# Patient Record
Sex: Male | Born: 1985 | Race: White | Hispanic: No | Marital: Married | Smoking: Former smoker
Health system: Southern US, Community
[De-identification: ages and names within clinical notes are randomized; demographics above are authoritative.]

## PROBLEM LIST (undated history)

## (undated) DIAGNOSIS — I10 Essential (primary) hypertension: Secondary | ICD-10-CM

## (undated) HISTORY — PX: TREATMENT FISTULA ANAL: SUR1390

## (undated) HISTORY — DX: Essential (primary) hypertension: I10

---
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
## 2008-10-15 ENCOUNTER — Emergency Department (HOSPITAL_COMMUNITY): Admission: EM | Admit: 2008-10-15 | Discharge: 2008-10-16 | Payer: Self-pay | Admitting: *Deleted

## 2015-05-06 ENCOUNTER — Other Ambulatory Visit (INDEPENDENT_AMBULATORY_CARE_PROVIDER_SITE_OTHER): Payer: Worker's Compensation

## 2015-05-06 DIAGNOSIS — Z7721 Contact with and (suspected) exposure to potentially hazardous body fluids: Secondary | ICD-10-CM

## 2015-05-06 NOTE — Progress Notes (Signed)
Lab only 

## 2015-05-07 LAB — HIV ANTIBODY (ROUTINE TESTING W REFLEX): HIV Screen 4th Generation wRfx: NONREACTIVE

## 2015-05-07 LAB — HEPATITIS PANEL, ACUTE
Hep A IgM: NEGATIVE
Hep B C IgM: NEGATIVE
Hep C Virus Ab: <0.1 s/co ratio (ref 0.0–0.9)
Hepatitis B Surface Ag: NEGATIVE

## 2015-05-09 ENCOUNTER — Telehealth: Payer: Self-pay | Admitting: *Deleted

## 2015-05-09 NOTE — Telephone Encounter (Signed)
-----   Message from Warren Stacks, MD sent at 05/07/2015 12:28 PM EDT ----- Hello Joseph Cabrera,    Your lab result is normal.Some minor variations that are not significant are commonly marked abnormal, but do not represent any medical problem for you.  Best regards, Warren Stacks, M.D. 

## 2015-05-09 NOTE — Telephone Encounter (Signed)
-----   Message from Mechele Claude, MD sent at 05/07/2015 12:28 PM EDT ----- Silvano Rusk,    Your lab result is normal.Some minor variations that are not significant are commonly marked abnormal, but do not represent any medical problem for you.  Best regards, Mechele Claude, M.D.

## 2015-05-09 NOTE — Telephone Encounter (Signed)
Pt notified of results Verbalizes understanding 

## 2015-05-09 NOTE — Telephone Encounter (Signed)
Tried to call pt regarding test results but no answer and no voicemail set up.

## 2015-06-03 ENCOUNTER — Encounter: Payer: Self-pay | Admitting: Physician Assistant

## 2015-06-03 ENCOUNTER — Ambulatory Visit (INDEPENDENT_AMBULATORY_CARE_PROVIDER_SITE_OTHER): Payer: Worker's Compensation | Admitting: Physician Assistant

## 2015-06-03 ENCOUNTER — Encounter (INDEPENDENT_AMBULATORY_CARE_PROVIDER_SITE_OTHER): Payer: Self-pay

## 2015-06-03 VITALS — BP 147/96 | HR 73 | Temp 97.1°F | Wt 185.0 lb

## 2015-06-03 DIAGNOSIS — W57XXXA Bitten or stung by nonvenomous insect and other nonvenomous arthropods, initial encounter: Secondary | ICD-10-CM

## 2015-06-03 DIAGNOSIS — S8991XA Unspecified injury of right lower leg, initial encounter: Secondary | ICD-10-CM | POA: Diagnosis not present

## 2015-06-03 DIAGNOSIS — Z23 Encounter for immunization: Secondary | ICD-10-CM | POA: Diagnosis not present

## 2015-06-03 DIAGNOSIS — L03115 Cellulitis of right lower limb: Secondary | ICD-10-CM
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
## 2015-06-03 NOTE — Patient Instructions (Signed)
Spider Bite Spider bites are not common. Most spider bites do not cause serious problems. The elderly, very young children, and people with certain existing medical conditions are more likely to experience significant symptoms. SYMPTOMS  Spider bites may not cause any pain at first. Within 1 or 2 days of the bite, there may be swelling, redness, and pain in the bite area. However, some spider bites can cause pain within the first hour. TREATMENT  Your caregiver may prescribe antibiotic medicine if a bacterial infection develops in the bite. However, not all spider bites require antibiotics or prescription medicines.  HOME CARE INSTRUCTIONS  Do not scratch the bite area.  Keep the bite area clean and dry. Wash the area with soap and water as directed.  Put ice or cool compresses on the bite area.  Put ice in a plastic bag.  Place a towel between your skin and the bag.  Leave the ice on for 20 minutes, 4 times a day for the first 2 to 3 days, or as directed.  Keep the bite area elevated above the level of your heart. This helps reduce redness and swelling.  Only take over-the-counter or prescription medicines as directed by your caregiver.  If you are given antibiotics, take them as directed. Finish them even if you start to feel better. You may need a tetanus shot if:  You cannot remember when you had your last tetanus shot.  You have never had a tetanus shot.  The injury broke your skin. If you get a tetanus shot, your arm may swell, get red, and feel warm to the touch. This is common and not a problem. If you need a tetanus shot and you choose not to have one, there is a rare chance of getting tetanus. Sickness from tetanus can be serious. SEEK MEDICAL CARE IF: Your bite is not better after 3 days of treatment. SEEK IMMEDIATE MEDICAL CARE IF:  Your bite turns purple or develops increased swelling, pain, or redness.  You develop shortness of breath or chest pain.  You have  muscle cramps or painful muscle spasms.  You develop abdominal pain, nausea, or vomiting.  You feel unusually tired or sleepy. MAKE SURE YOU:  Understand these instructions.  Will watch your condition.  Will get help right away if you are not doing well or get worse. Document Released: 12/20/2004 Document Revised: 02/04/2012 Document Reviewed: 06/13/2011 ExitCare Patient Information 2015 [employer]. This information is not intended to replace advice given to you by your health care provider. Make sure you discuss any questions you have with your health care provider.  

## 2015-06-03 NOTE — Addendum Note (Signed)
Addended by: Fawn KirkHOLT, CATHY on: 06/03/2015 09:46 AM   Modules accepted: Orders

## 2015-06-10 ENCOUNTER — Ambulatory Visit: Payer: Self-pay | Admitting: Physician Assistant

## 2015-06-13 ENCOUNTER — Telehealth: Payer: Self-pay | Admitting: Physician Assistant

## 2015-06-13 ENCOUNTER — Ambulatory Visit: Payer: Self-pay | Admitting: Physician Assistant

## 2015-06-13 NOTE — Telephone Encounter (Signed)
Stp and gave appt tomorrow with Montey HoraBill Webster at 4:10 for W/C recheck.

## 2015-06-14 ENCOUNTER — Ambulatory Visit (INDEPENDENT_AMBULATORY_CARE_PROVIDER_SITE_OTHER): Payer: Worker's Compensation | Admitting: Physician Assistant

## 2015-06-14 ENCOUNTER — Encounter: Payer: Self-pay | Admitting: Physician Assistant

## 2015-06-14 VITALS — BP 152/98 | HR 86 | Wt 167.0 lb

## 2015-06-14 DIAGNOSIS — L03115 Cellulitis of right lower limb: Secondary | ICD-10-CM

## 2015-06-14 DIAGNOSIS — S8991XD Unspecified injury of right lower leg, subsequent encounter: Secondary | ICD-10-CM | POA: Diagnosis not present

## 2015-06-14 DIAGNOSIS — W57XXXA Bitten or stung by nonvenomous insect and other nonvenomous arthropods, initial encounter: Secondary | ICD-10-CM
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
## 2016-02-10 ENCOUNTER — Emergency Department (HOSPITAL_COMMUNITY)
Admission: EM | Admit: 2016-02-10 | Discharge: 2016-02-10 | Disposition: A | Discharge status: Home / Self Care | Payer: PRIVATE HEALTH INSURANCE | Attending: Emergency Medicine | Admitting: Emergency Medicine

## 2016-02-10 ENCOUNTER — Encounter (HOSPITAL_COMMUNITY): Payer: Self-pay | Admitting: *Deleted

## 2016-02-10 DIAGNOSIS — Z87891 Personal history of nicotine dependence: Secondary | ICD-10-CM | POA: Insufficient documentation

## 2016-02-10 DIAGNOSIS — R110 Nausea: Secondary | ICD-10-CM | POA: Insufficient documentation

## 2016-02-10 DIAGNOSIS — R42 Dizziness and giddiness: Secondary | ICD-10-CM | POA: Diagnosis present

## 2016-02-10 DIAGNOSIS — R002 Palpitations: Secondary | ICD-10-CM | POA: Insufficient documentation

## 2016-02-10 LAB — BASIC METABOLIC PANEL WITH GFR
Anion gap: 6 (ref 5–15)
BUN: 11 mg/dL (ref 6–20)
CO2: 30 mmol/L (ref 22–32)
Calcium: 9.7 mg/dL (ref 8.9–10.3)
Chloride: 102 mmol/L (ref 101–111)
Creatinine, Ser: 1.05 mg/dL (ref 0.61–1.24)
GFR calc Af Amer: >60 mL/min (ref >60)
GFR calc non Af Amer: >60 mL/min (ref >60)
Glucose, Bld: 102 mg/dL — ABNORMAL HIGH (ref 65–99)
Potassium: 4.5 mmol/L (ref 3.5–5.1)
Sodium: 138 mmol/L (ref 135–145)

## 2016-02-10 LAB — CBC WITH DIFFERENTIAL/PLATELET
Basophils Absolute: 0 K/uL (ref 0.0–0.1)
Basophils Relative: 0 %
Eosinophils Absolute: 0.2 K/uL (ref 0.0–0.7)
Eosinophils Relative: 2 %
HCT: 48.3 % (ref 39.0–52.0)
Hemoglobin: 16.6 g/dL (ref 13.0–17.0)
Lymphocytes Relative: 30 %
Lymphs Abs: 2.4 K/uL (ref 0.7–4.0)
MCH: 30.3 pg (ref 26.0–34.0)
MCHC: 34.4 g/dL (ref 30.0–36.0)
MCV: 88.1 fL (ref 78.0–100.0)
Monocytes Absolute: 0.6 K/uL (ref 0.1–1.0)
Monocytes Relative: 7 %
Neutro Abs: 4.7 K/uL (ref 1.7–7.7)
Neutrophils Relative %: 61 %
Platelets: 288 K/uL (ref 150–400)
RBC: 5.48 MIL/uL (ref 4.22–5.81)
RDW: 11.8 % (ref 11.5–15.5)
WBC: 7.8 K/uL (ref 4.0–10.5)

## 2016-02-10 NOTE — ED Provider Notes (Signed)
CSN: [id number]     Arrival date & time 02/10/16  1326 History   First MD Initiated Contact with Patient 02/10/16 1516     Chief Complaint  Patient presents with  . Dizziness     (Consider location/radiation/quality/duration/timing/severity/associated sxs/prior Treatment) HPI  Joseph Cabrera is a 30 y.o. male who presents to the Emergency Department complaining of sudden onset of "feeling dizzy and lightheaded" earlier today while at work.  He also reports associated nausea and felt like his heart was "fluttering."  Symptoms lasted approximately one hour.  He states that EMS was contacted, and it was noted that his BP was elevated.  He states that this has occurred previously but improved after discontinuing chewing tobacco.  He also admits to high salt, high caffeine diet and minimal water intake.  He denies headache, visual changes, recent illness, chest pain, shortness of breath, numbness or weakness.  Also denies medication or illicit drug use.   History reviewed. No pertinent past medical history. History reviewed. No pertinent past surgical history. History reviewed. No pertinent family history. Social History  Substance Use Topics  . Smoking status: Former Games developer  . Smokeless tobacco: None  . Alcohol Use: Yes     Comment: occasional    Review of Systems  Constitutional: Negative for fever, chills, activity change and appetite change.  Respiratory: Negative for cough, chest tightness and shortness of breath.   Gastrointestinal: Positive for nausea. Negative for vomiting and abdominal pain.  Genitourinary: Negative for difficulty urinating.  Musculoskeletal: Negative for arthralgias, gait problem, neck pain and neck stiffness.  Skin: Negative for rash.  Neurological: Positive for dizziness and light-headedness. Negative for seizures, syncope, speech difficulty, weakness, numbness and headaches.  Psychiatric/Behavioral: Negative for confusion. The patient is not nervous/anxious.        Allergies  Review of patient's allergies indicates no known allergies.  Home Medications   Prior to Admission medications   Not on File   BP 164/98 mmHg  Pulse 100  Temp(Src) 98.6 F (37 C) (Oral)  Ht  (1.778 m)  Wt 82.555 kg  BMI 26.11 kg/m2  SpO2 98% Physical Exam  Constitutional: He is oriented to person, place, and time. He appears well-developed and well-nourished. No distress.  HENT:  Head: Normocephalic and atraumatic.  Mouth/Throat: Oropharynx is clear and moist.  Eyes: EOM are normal. Pupils are equal, round, and reactive to light.  Neck: Normal range of motion. Neck supple.  Cardiovascular: Normal rate, regular rhythm, normal heart sounds and intact distal pulses.   No murmur heard. Pulmonary/Chest: Effort normal and breath sounds normal. No respiratory distress. He exhibits no tenderness.  Musculoskeletal: Normal range of motion.  Neurological: He is alert and oriented to person, place, and time. Coordination normal.  Skin: Skin is warm and dry.  Psychiatric: He has a normal mood and affect. Thought content normal.  Nursing note and vitals reviewed.   ED Course  Procedures (including critical care time) Labs Review Labs Reviewed  BASIC METABOLIC PANEL - Abnormal; Notable for the following:    Glucose, Bld 102 (*)    All other components within normal limits  CBC WITH DIFFERENTIAL/PLATELET    Imaging Review No results found. I have personally reviewed and evaluated these images and lab results as part of my medical decision-making.   EKG Interpretation None       ED ECG REPORT   Date: 02/10/2016  Rate: 95  Rhythm: normal sinus rhythm  QRS Axis: normal  Intervals: normal  ST/T Wave  abnormalities: normal  Conduction Disutrbances:none  Narrative Interpretation:   Old EKG Reviewed: none available   EKG reviewed by Dr. Jodi MourningZavitz.    MDM   Final diagnoses:  Dizziness, nonspecific  Palpitations    This is a shared visit.  Pt  also seen by Dr. Jodi MourningZavitz and tx plan discussed.    Discussed dietary changes, avoidance of caffeine, increase water intake and close PMD f/u.  I will also give referral for cards for possible holter monitor.  Pt agrees to plan and appears stable for d/c  Pauline Ausammy Amayah Staheli, PA-C 02/11/16 2122  Blane OharaJoshua Zavitz, MD 02/11/16 2326

## 2016-02-10 NOTE — Discharge Instructions (Signed)
Dizziness Dizziness is a common problem. It makes you feel unsteady or lightheaded. You may feel like you are about to pass out (faint). Dizziness can lead to injury if you stumble or fall. Anyone can get dizzy, but dizziness is more common in older adults. This condition can be caused by a number of things, including:  Medicines.  Dehydration.  Illness. HOME CARE Following these instructions may help with your condition: Eating and Drinking  Drink enough fluid to keep your pee (urine) clear or pale yellow. This helps to keep you from getting dehydrated. Try to drink more clear fluids, such as water.  Do not drink alcohol.  Limit how much caffeine you drink or eat if told by your doctor.  Limit how much salt you drink or eat if told by your doctor. Activity  Avoid making quick movements.  When you stand up from sitting in a chair, steady yourself until you feel okay.  In the morning, first sit up on the side of the bed. When you feel okay, stand slowly while you hold onto something. Do this until you know that your balance is fine.  Move your legs often if you need to stand in one place for a long time. Tighten and relax your muscles in your legs while you are standing.  Do not drive or use heavy machinery if you feel dizzy.  Avoid bending down if you feel dizzy. Place items in your home so that they are easy for you to reach without leaning over. Lifestyle  Do not use any tobacco products, including cigarettes, chewing tobacco, or electronic cigarettes. If you need help quitting, ask your doctor.  Try to lower your stress level, such as with yoga or meditation. Talk with your doctor if you need help. General Instructions  Watch your dizziness for any changes.  Take medicines only as told by your doctor. Talk with your doctor if you think that your dizziness is caused by a medicine that you are taking.  Tell a friend or a family member that you are feeling dizzy. If he or  she notices any changes in your behavior, have this person call your doctor.  Keep all follow-up visits as told by your doctor. This is important. GET HELP IF:  Your dizziness does not go away.  Your dizziness or light-headedness gets worse.  You feel sick to your stomach (nauseous).  You have trouble hearing.  You have new symptoms.  You are unsteady on your feet or you feel like the room is spinning. GET HELP RIGHT AWAY IF:  You throw up (vomit) or have diarrhea and are unable to eat or drink anything.  You have trouble:  Talking.  Walking.  Swallowing.  Using your arms, hands, or legs.  You feel generally weak.  You are not thinking clearly or you have trouble forming sentences. It may take a friend or family member to notice this.  You have:  Chest pain.  Pain in your belly (abdomen).  Shortness of breath.  Sweating.  Your vision changes.  You are bleeding.  You have a headache.  You have neck pain or a stiff neck.  You have a fever.   This information is not intended to replace advice given to you by your health care provider. Make sure you discuss any questions you have with your health care provider.   Document Released: 11/01/2011 Document Revised: 03/29/2015 Document Reviewed: 11/08/2014 Elsevier Interactive Patient Education 2016 [employer].  Palpitations A palpitation is  the feeling that your heartbeat is irregular. It may feel like your heart is fluttering or skipping a beat. It may also feel like your heart is beating faster than normal. This is usually not a serious problem. In some cases, you may need more medical tests. HOME CARE  Avoid:  Caffeine in coffee, tea, soft drinks, diet pills, and energy drinks.  Chocolate.  Alcohol.  Stop smoking if you smoke.  Reduce your stress and anxiety. Try:  A method that measures bodily functions so you can learn to control them (biofeedback).  Yoga.  Meditation.  Physical activity  such as swimming, jogging, or walking.  Get plenty of rest and sleep. GET HELP IF:  Your fast or irregular heartbeat continues after 24 hours.  Your palpitations occur more often. GET HELP RIGHT AWAY IF:   You have chest pain.  You feel short of breath.  You have a very bad headache.  You feel dizzy or pass out (faint). MAKE SURE YOU:   Understand these instructions.  Will watch your condition.  Will get help right away if you are not doing well or get worse.   This information is not intended to replace advice given to you by your health care provider. Make sure you discuss any questions you have with your health care provider.   Document Released: 08/21/2008 Document Revised: 12/03/2014 Document Reviewed: 01/11/2012 Elsevier Interactive Patient Education [employer]2016 Elsevier Inc.

## 2016-02-10 NOTE — ED Notes (Signed)
Pt was at work today and became lightheaded and nauseated. EMS called out and BP was 159/94. Patient reports "heart fluttering" at that time. Denies any pain at this time, but is still nauseated.

## 2016-09-13 ENCOUNTER — Ambulatory Visit (INDEPENDENT_AMBULATORY_CARE_PROVIDER_SITE_OTHER): Payer: PRIVATE HEALTH INSURANCE | Admitting: *Deleted

## 2016-09-13 DIAGNOSIS — Z23 Encounter for immunization: Secondary | ICD-10-CM | POA: Diagnosis not present

## 2017-06-12 ENCOUNTER — Ambulatory Visit (INDEPENDENT_AMBULATORY_CARE_PROVIDER_SITE_OTHER): Payer: BC Managed Care – PPO | Admitting: Physician Assistant

## 2017-06-12 ENCOUNTER — Encounter: Payer: Self-pay | Admitting: Physician Assistant

## 2017-06-12 VITALS — BP 131/92 | HR 76 | Temp 97.5°F | Ht 70.0 in | Wt 190.6 lb

## 2017-06-12 DIAGNOSIS — F4310 Post-traumatic stress disorder, unspecified: Secondary | ICD-10-CM

## 2017-06-12 DIAGNOSIS — I10 Essential (primary) hypertension: Secondary | ICD-10-CM

## 2017-06-12 MED ORDER — TRAZODONE HCL 50 MG PO TABS: 50.0000 mg | ORAL_TABLET | Freq: Every evening | ORAL | 3 refills | Status: DC | PRN

## 2017-06-12 MED ORDER — LISINOPRIL 10 MG PO TABS: 10.0000 mg | ORAL_TABLET | Freq: Every day | ORAL | 3 refills | Status: DC

## 2017-06-12 NOTE — Progress Notes (Signed)
BP (!) 131/92   Pulse 76   Temp (!) 97.5 F (36.4 C) (Oral)   Ht 5\' 10"  (1.778 m)   Wt 190 lb 9.6 oz (86.5 kg)   BMI 27.35 kg/m    Subjective:    Patient ID: Joseph Cabrera, male    DOB: 1986/03/21, 31 y.o.   MRN: [id number]  HPI: Joseph Cabrera is a 31 y.o. male presenting on 06/12/2017 for New Patient (Initial Visit); Hypertension; and Post-Traumatic Stress Disorder  This is a new patient to us. He is overall a healthy young male. 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 He has had some issues with elevated blood pressure in the past. He has had several readings where they're in the 140-150 range systolically. He has a sister with elevated blood pressure also. He has tried ram up real but never really reduced his blood pressure. In addition he is having significant stressors related to the job. He has some posttraumatic thinking related to some things that he is seen. He has interrupted sleep. His pHQ score was only 4. Depression screen PHQ 2/9 06/12/2017  Decreased Interest 1  Down, Depressed, Hopeless 1  PHQ - 2 Score 2  Altered sleeping 1  Tired, decreased energy 1  Change in appetite 0  Feeling bad or failure about yourself  0  Trouble concentrating 0  Moving slowly or fidgety/restless 0  Suicidal thoughts 0  PHQ-9 Score 4     Relevant past medical, surgical, family and social history reviewed and updated as indicated. Allergies and medications reviewed and updated.  Past Medical History:  Diagnosis Date  . Hypertension     History reviewed. No pertinent surgical history.  Review of Systems  Constitutional: Negative.  Negative for appetite change and fatigue.  HENT: Negative.   Eyes: Negative.  Negative for pain and visual disturbance.  Respiratory: Negative.  Negative for cough, chest tightness, shortness of breath and wheezing.   Cardiovascular: Negative.  Negative for chest pain, palpitations and leg swelling.  Gastrointestinal:  Negative.  Negative for abdominal pain, diarrhea, nausea and vomiting.  Endocrine: Negative.   Genitourinary: Negative.   Musculoskeletal: Negative.   Skin: Negative.  Negative for color change and rash.  Neurological: Negative.  Negative for weakness, numbness and headaches.  Psychiatric/Behavioral: Positive for agitation and sleep disturbance. Negative for dysphoric mood. The patient is nervous/anxious.     Allergies as of 06/12/2017      Reactions   Shellfish Allergy Anaphylaxis      Medication List       Accurate as of 06/12/17  9:08 AM. Always use your most recent med list.          lisinopril 10 MG tablet Commonly known as:  PRINIVIL,ZESTRIL Take 1 tablet (10 mg total) by mouth daily.   traZODone 50 MG tablet Commonly known as:  DESYREL Take 1-2 tablets (50-100 mg total) by mouth at bedtime as needed for sleep.          Objective:    BP (!) 131/92   Pulse 76   Temp (!) 97.5 F (36.4 C) (Oral)   Ht 5\' 10"  (1.778 m)   Wt 190 lb 9.6 oz (86.5 kg)   BMI 27.35 kg/m   Allergies  Allergen Reactions  . Shellfish Allergy Anaphylaxis    Physical Exam  Constitutional: He appears well-developed and well-nourished.  HENT:  Head: Normocephalic and atraumatic.  Eyes: Pupils are equal, round, and reactive to light. Conjunctivae and EOM  are normal.  Neck: Normal range of motion. Neck supple.  Cardiovascular: Normal rate, regular rhythm and normal heart sounds.   Pulmonary/Chest: Effort normal and breath sounds normal.  Abdominal: Soft. Bowel sounds are normal.  Musculoskeletal: Normal range of motion.  Skin: Skin is warm and dry.  Nursing note and vitals reviewed.       Assessment & Plan:   1. Essential hypertension - lisinopril (PRINIVIL,ZESTRIL) 10 MG tablet; Take 1 tablet (10 mg total) by mouth daily.  Dispense: 90 tablet; Refill: 3  2. PTSD (post-traumatic stress disorder) - traZODone (DESYREL) 50 MG tablet; Take 1-2 tablets (50-100 mg total) by mouth at  bedtime as needed for sleep.  Dispense: 60 tablet; Refill: 3    Continue all other maintenance medications as listed above.  Follow up plan: Return in about 4 weeks (around 07/10/2017) for recheck.  Educational handout given for psychiatry  Remus Loffler PA-C [facility] [address]  [place], [place] [address] [phone]   06/12/2017, 9:08 AM

## 2017-06-12 NOTE — Patient Instructions (Signed)
Your provider wants you to schedule an appointment with a Psychologist/Psychiatrist. The following list of offices requires the patient to call and make their own appointment, as there is information they need that only you can provide. Please feel free to choose form the following providers:  [facility]   [phone] Crisis Recovery in [place] [phone]  [facility]  [phone]   [address] [place], [place]  (Scheduled through [facility]) Must call and do an interview for appointment. Sees Children / Accepts Medicaid  [facility]    [phone]  [address], [address]    [place], [place]       [facility]  [phone] [address] [place], [place]  Evaluates for Autism but does not treat it Sees Children / Accepts Medicaid  [facility]    [phone] [address], [address]   [place], [place] Medication management, substance abuse, bipolar, grief, family, marriage, OCD, anxiety, PTSD Sees children / Accepts Medicaid  [facility]    [phone] [address] [place], [place] Sees children / Accepts Medicaid  [facility]  [phone] [address] [place], [place]   Dr Akinlayo     [phone] [address] [place], [place]  Sees ADD & ADHD for treatment Accepts Medicaid  [facility]  [phone] [address] [place], [place] Evaluates for Autism Accepts Medicaid  [facility]  [phone] [address]  [place], [place]  Does Adult ADD evaluations Does not accept Medicaid  [facility]   [phone] [address]   [place], [place] Uses animal therapy  Sees children as young as 3 years old Accepts Medicaid  [facility]     [phone]    [address]  [place], [place] [address] Sees children Accepts Medicaid  

## 2017-07-11 ENCOUNTER — Encounter: Payer: Self-pay | Admitting: Physician Assistant

## 2017-07-11 ENCOUNTER — Ambulatory Visit (INDEPENDENT_AMBULATORY_CARE_PROVIDER_SITE_OTHER): Payer: BC Managed Care – PPO | Admitting: Physician Assistant

## 2017-07-11 VITALS — BP 112/72 | HR 83 | Temp 98.5°F | Ht 70.0 in | Wt 184.4 lb

## 2017-07-11 DIAGNOSIS — I10 Essential (primary) hypertension: Secondary | ICD-10-CM | POA: Diagnosis not present

## 2017-07-11 DIAGNOSIS — F4310 Post-traumatic stress disorder, unspecified: Secondary | ICD-10-CM

## 2017-07-11 MED ORDER — TRAZODONE HCL 100 MG PO TABS: 50.0000 mg | ORAL_TABLET | Freq: Every evening | ORAL | 5 refills | Status: DC | PRN

## 2017-07-11 NOTE — Progress Notes (Signed)
BP 112/72   Pulse 83   Temp 98.5 F (36.9 C) (Oral)   Ht 5\' 10"  (1.778 m)   Wt 184 lb 6.4 oz (83.6 kg)   BMI 26.46 kg/m    Subjective:    Patient ID: Joseph Cabrera, male    DOB: 01/07/1986, 31 y.o.   MRN: [id number]  HPI: Joseph Cabrera is a 31 y.o. male presenting on 07/11/2017 for Follow-up (Bp meds and depression meds)  This patient comes in for periodic recheck on medications and conditions including Hypertension and posttraumatic stress disorder with sleep disturbance. He states that he has been taking both of the medications. The first couple weeks when he started the trazodone 50-100 mg at bedtime it improved his sleep. However he is not having as much improvement now. We have discussed increasing this medication. He has tolerated it well. We will go ahead and progress until 200 mg. His blood pressure is quite good. He is tolerating the medication very well. We'll plan to see him back in a few months. He is to call if there is any changes in his symptoms.   All medications are reviewed today. There are no reports of any problems with the medications. All of the medical conditions are reviewed and updated.  Lab work is reviewed and will be ordered as medically necessary. There are no new problems reported with today's visit.   Relevant past medical, surgical, family and social history reviewed and updated as indicated. Allergies and medications reviewed and updated.  Past Medical History:  Diagnosis Date  . Hypertension     History reviewed. No pertinent surgical history.  Review of Systems  Constitutional: Negative.  Negative for appetite change and fatigue.  HENT: Negative.   Eyes: Negative.  Negative for pain and visual disturbance.  Respiratory: Negative.  Negative for cough, chest tightness, shortness of breath and wheezing.   Cardiovascular: Negative.  Negative for chest pain, palpitations and leg swelling.  Gastrointestinal: Negative.  Negative for abdominal pain,  diarrhea, nausea and vomiting.  Endocrine: Negative.   Genitourinary: Negative.   Musculoskeletal: Negative.   Skin: Negative.  Negative for color change and rash.  Neurological: Negative.  Negative for weakness, numbness and headaches.  Psychiatric/Behavioral: Positive for sleep disturbance. The patient is nervous/anxious.     Allergies as of 07/11/2017      Reactions   Shellfish Allergy Anaphylaxis      Medication List       Accurate as of 07/11/17  9:21 AM. Always use your most recent med list.          lisinopril 10 MG tablet Commonly known as:  PRINIVIL,ZESTRIL Take 1 tablet (10 mg total) by mouth daily.   traZODone 100 MG tablet Commonly known as:  DESYREL Take 0.5-1 tablets (50-100 mg total) by mouth at bedtime as needed for sleep.          Objective:    BP 112/72   Pulse 83   Temp 98.5 F (36.9 C) (Oral)   Ht 5\' 10"  (1.778 m)   Wt 184 lb 6.4 oz (83.6 kg)   BMI 26.46 kg/m   Allergies  Allergen Reactions  . Shellfish Allergy Anaphylaxis    Physical Exam  Constitutional: He appears well-developed and well-nourished. No distress.  HENT:  Head: Normocephalic and atraumatic.  Eyes: Pupils are equal, round, and reactive to light. Conjunctivae and EOM are normal.  Cardiovascular: Normal rate, regular rhythm and normal heart sounds.   Pulmonary/Chest: Effort normal and breath sounds  normal. No respiratory distress.  Skin: Skin is warm and dry.  Psychiatric: He has a normal mood and affect. His behavior is normal.  Nursing note and vitals reviewed.       Assessment & Plan:   1. Essential hypertension  2. PTSD (post-traumatic stress disorder) - traZODone (DESYREL) 100 MG tablet; Take 0.5-1 tablets (50-100 mg total) by mouth at bedtime as needed for sleep.  Dispense: 60 tablet; Refill: 5    Current Outpatient Prescriptions:  .  lisinopril (PRINIVIL,ZESTRIL) 10 MG tablet, Take 1 tablet (10 mg total) by mouth daily., Disp: 90 tablet, Rfl: 3 .   traZODone (DESYREL) 100 MG tablet, Take 0.5-1 tablets (50-100 mg total) by mouth at bedtime as needed for sleep., Disp: 60 tablet, Rfl: 5 Continue all other maintenance medications as listed above.  Follow up plan: Return in about 6 months (around 01/11/2018).  Educational handout given for DASH diet  Remus Loffler PA-C [facility] [address]  [place], [place] [address] [phone]   07/11/2017, 9:21 AM

## 2017-07-11 NOTE — Patient Instructions (Signed)
DASH Eating Plan DASH stands for "Dietary Approaches to Stop Hypertension." The DASH eating plan is a healthy eating plan that has been shown to reduce high blood pressure (hypertension). It may also reduce your risk for type 2 diabetes, heart disease, and stroke. The DASH eating plan may also help with weight loss. What are tips for following this plan? General guidelines  Avoid eating more than 2,300 mg (milligrams) of salt (sodium) a day. If you have hypertension, you may need to reduce your sodium intake to 1,500 mg a day.  Limit alcohol intake to no more than 1 drink a day for nonpregnant women and 2 drinks a day for men. One drink equals 12 oz of beer, 5 oz of wine, or 1 oz of hard liquor.  Work with your health care provider to maintain a healthy body weight or to lose weight. Ask what an ideal weight is for you.  Get at least 30 minutes of exercise that causes your heart to beat faster (aerobic exercise) most days of the week. Activities may include walking, swimming, or biking.  Work with your health care provider or diet and nutrition specialist (dietitian) to adjust your eating plan to your individual calorie needs. Reading food labels  Check food labels for the amount of sodium per serving. Choose foods with less than 5 percent of the Daily Value of sodium. Generally, foods with less than 300 mg of sodium per serving fit into this eating plan.  To find whole grains, look for the word "whole" as the first word in the ingredient list. Shopping  Buy products labeled as "low-sodium" or "no salt added."  Buy fresh foods. Avoid canned foods and premade or frozen meals. Cooking  Avoid adding salt when cooking. Use salt-free seasonings or herbs instead of table salt or sea salt. Check with your health care provider or pharmacist before using salt substitutes.  Do not fry foods. Cook foods using healthy methods such as baking, boiling, grilling, and broiling instead.  Cook with  heart-healthy oils, such as olive, canola, soybean, or sunflower oil. Meal planning   Eat a balanced diet that includes: ? 5 or more servings of fruits and vegetables each day. At each meal, try to fill half of your plate with fruits and vegetables. ? Up to 6-8 servings of whole grains each day. ? Less than 6 oz of lean meat, poultry, or fish each day. A 3-oz serving of meat is about the same size as a deck of cards. One egg equals 1 oz. ? 2 servings of low-fat dairy each day. ? A serving of nuts, seeds, or beans 5 times each week. ? Heart-healthy fats. Healthy fats called Omega-3 fatty acids are found in foods such as flaxseeds and coldwater fish, like sardines, salmon, and mackerel.  Limit how much you eat of the following: ? Canned or prepackaged foods. ? Food that is high in trans fat, such as fried foods. ? Food that is high in saturated fat, such as fatty meat. ? Sweets, desserts, sugary drinks, and other foods with added sugar. ? Full-fat dairy products.  Do not salt foods before eating.  Try to eat at least 2 vegetarian meals each week.  Eat more home-cooked food and less restaurant, buffet, and fast food.  When eating at a restaurant, ask that your food be prepared with less salt or no salt, if possible. What foods are recommended? The items listed may not be a complete list. Talk with your dietitian about what   dietary choices are best for you. Grains Whole-grain or whole-wheat bread. Whole-grain or whole-wheat pasta. Brown rice. Oatmeal. Quinoa. Bulgur. Whole-grain and low-sodium cereals. Pita bread. Low-fat, low-sodium crackers. Whole-wheat flour tortillas. Vegetables Fresh or frozen vegetables (raw, steamed, roasted, or grilled). Low-sodium or reduced-sodium tomato and vegetable juice. Low-sodium or reduced-sodium tomato sauce and tomato paste. Low-sodium or reduced-sodium canned vegetables. Fruits All fresh, dried, or frozen fruit. Canned fruit in natural juice (without  added sugar). Meat and other protein foods Skinless chicken or turkey. Ground chicken or turkey. Pork with fat trimmed off. Fish and seafood. Egg whites. Dried beans, peas, or lentils. Unsalted nuts, nut butters, and seeds. Unsalted canned beans. Lean cuts of beef with fat trimmed off. Low-sodium, lean deli meat. Dairy Low-fat (1%) or fat-free (skim) milk. Fat-free, low-fat, or reduced-fat cheeses. Nonfat, low-sodium ricotta or cottage cheese. Low-fat or nonfat yogurt. Low-fat, low-sodium cheese. Fats and oils Soft margarine without trans fats. Vegetable oil. Low-fat, reduced-fat, or light mayonnaise and salad dressings (reduced-sodium). Canola, safflower, olive, soybean, and sunflower oils. Avocado. Seasoning and other foods Herbs. Spices. Seasoning mixes without salt. Unsalted popcorn and pretzels. Fat-free sweets. What foods are not recommended? The items listed may not be a complete list. Talk with your dietitian about what dietary choices are best for you. Grains Baked goods made with fat, such as croissants, muffins, or some breads. Dry pasta or rice meal packs. Vegetables Creamed or fried vegetables. Vegetables in a cheese sauce. Regular canned vegetables (not low-sodium or reduced-sodium). Regular canned tomato sauce and paste (not low-sodium or reduced-sodium). Regular tomato and vegetable juice (not low-sodium or reduced-sodium). Pickles. Olives. Fruits Canned fruit in a light or heavy syrup. Fried fruit. Fruit in cream or butter sauce. Meat and other protein foods Fatty cuts of meat. Ribs. Fried meat. Bacon. Sausage. Bologna and other processed lunch meats. Salami. Fatback. Hotdogs. Bratwurst. Salted nuts and seeds. Canned beans with added salt. Canned or smoked fish. Whole eggs or egg yolks. Chicken or turkey with skin. Dairy Whole or 2% milk, cream, and half-and-half. Whole or full-fat cream cheese. Whole-fat or sweetened yogurt. Full-fat cheese. Nondairy creamers. Whipped toppings.  Processed cheese and cheese spreads. Fats and oils Butter. Stick margarine. Lard. Shortening. Ghee. Bacon fat. Tropical oils, such as coconut, palm kernel, or palm oil. Seasoning and other foods Salted popcorn and pretzels. Onion salt, garlic salt, seasoned salt, table salt, and sea salt. Worcestershire sauce. Tartar sauce. Barbecue sauce. Teriyaki sauce. Soy sauce, including reduced-sodium. Steak sauce. Canned and packaged gravies. Fish sauce. Oyster sauce. Cocktail sauce. Horseradish that you find on the shelf. Ketchup. Mustard. Meat flavorings and tenderizers. Bouillon cubes. Hot sauce and Tabasco sauce. Premade or packaged marinades. Premade or packaged taco seasonings. Relishes. Regular salad dressings. Where to find more information:  National Heart, Lung, and Blood Institute: www.nhlbi.nih.gov  American Heart Association: www.heart.org Summary  The DASH eating plan is a healthy eating plan that has been shown to reduce high blood pressure (hypertension). It may also reduce your risk for type 2 diabetes, heart disease, and stroke.  With the DASH eating plan, you should limit salt (sodium) intake to 2,300 mg a day. If you have hypertension, you may need to reduce your sodium intake to 1,500 mg a day.  When on the DASH eating plan, aim to eat more fresh fruits and vegetables, whole grains, lean proteins, low-fat dairy, and heart-healthy fats.  Work with your health care provider or diet and nutrition specialist (dietitian) to adjust your eating plan to your individual   calorie needs. This information is not intended to replace advice given to you by your health care provider. Make sure you discuss any questions you have with your health care provider. Document Released: 11/01/2011 Document Revised: 11/05/2016 Document Reviewed: 11/05/2016 Elsevier Interactive Patient Education  2017 Elsevier Inc.  

## 2017-12-23 ENCOUNTER — Encounter: Payer: Self-pay | Admitting: Pediatrics

## 2017-12-23 ENCOUNTER — Ambulatory Visit: Payer: BLUE CROSS/BLUE SHIELD | Admitting: Pediatrics

## 2017-12-23 VITALS — BP 137/86 | HR 93 | Temp 98.1°F | Ht 70.0 in | Wt 178.2 lb

## 2017-12-23 DIAGNOSIS — J069 Acute upper respiratory infection, unspecified: Secondary | ICD-10-CM | POA: Diagnosis not present

## 2017-12-23 NOTE — Progress Notes (Signed)
  Subjective:   Patient ID: Joseph Cabrera, male    DOB: 1986/10/25, 32 y.o.   MRN: [id number] CC: Fever (101 last night. 99.5 before left the house); Sinusitis (head congestion last day & a half no body aches or not); and Cough (not that bad, son doesn't have flu just bad sinus w/ cough)  HPI: Joseph BinetJoshua Norrod is a 32 y.o. male presenting for Fever (101 last night. 99.5 before left the house); Sinusitis (head congestion last day & a half no body aches or not); and Cough (not that bad, son doesn't have flu just bad sinus w/ cough)  Symptoms started two days ago Taking nyquil cough at night, allergy pill at night, with temp this morning of 101 he took ibuprofen Nasal congestion bothering him the most Son sick with similar URI symptoms Appetite has been down x 2 days No SOB, no trouble breathing Cough is not productive No sore throat  Relevant past medical, surgical, family and social history reviewed. Allergies and medications reviewed and updated. Social History   Tobacco Use  Smoking Status Former Smoker  Smokeless Tobacco Biochemist, clinicalCurrent User  . Types: Chew   ROS: Per HPI   Objective:    BP 137/86   Pulse 93   Temp 98.1 F (36.7 C) (Oral)   Ht 5\' 10"  (1.778 m)   Wt 178 lb 3.2 oz (80.8 kg)   BMI 25.57 kg/m   Wt Readings from Last 3 Encounters:  12/23/17 178 lb 3.2 oz (80.8 kg)  07/11/17 184 lb 6.4 oz (83.6 kg)  06/12/17 190 lb 9.6 oz (86.5 kg)    Gen: NAD, alert, cooperative with exam, NCAT EYES: EOMI, no conjunctival injection, or no icterus ENT:  R TM slightly injected, no effusion, L TM nl. TMs pearly gray b/l, OP with mild erythema, mildly ttp over max and frontal sinuses LYMPH: no cervical LAD CV: NRRR, normal S1/S2, no murmur, distal pulses 2+ b/l Resp: CTABL, no wheezes, normal WOB Ext: No edema, warm Neuro: Alert and oriented, strength equal b/l UE and LE, coordination grossly normal MSK: normal muscle bulk  Assessment & Plan:  [name] was seen today for acute  URI.  Diagnoses and all orders for this visit:  Acute URI Sick for past two days Symptom care discussed, return precautions Note given for work  Follow up plan: Return if symptoms worsen or fail to improve. Rex Krasarol Mazie Fencl, MD [facility]

## 2017-12-23 NOTE — Patient Instructions (Signed)
Nasal congestion: Netipot with distilled water 2-3 times a day to clear out sinuses  Or Normal saline nasal spray Flonase steroid nasal spray  For sore throat can use: Ibuprofen 400- 600mg three times a day Throat lozenges chloroseptic spray  Stick with bland foods Drink lots of fluids  

## 2018-02-19 ENCOUNTER — Ambulatory Visit: Payer: BLUE CROSS/BLUE SHIELD | Admitting: Family Medicine

## 2018-02-19 ENCOUNTER — Encounter: Payer: Self-pay | Admitting: Family Medicine

## 2018-02-19 VITALS — BP 124/77 | HR 71 | Temp 98.0°F | Ht 70.0 in | Wt 175.2 lb

## 2018-02-19 DIAGNOSIS — A084 Viral intestinal infection, unspecified: Secondary | ICD-10-CM

## 2018-02-19 MED ORDER — ONDANSETRON 8 MG PO TBDP: 8.0000 mg | ORAL_TABLET | Freq: Three times a day (TID) | ORAL | 0 refills | Status: DC | PRN

## 2018-02-19 NOTE — Progress Notes (Signed)
Subjective:  Patient ID: Joseph Cabrera Cabrera, male    DOB: [birth year]  Age: 32 y.o. MRN: [id number]  CC: Emesis   HPI Joseph Cabrera Kings presents for onset yesterday evening of emesis.  He was in his usual good state of health.  Then close to bedtime he started feeling ill including nausea and headache.  Between 11 and 3 this morning he had several episodes of emesis and now continues to be nauseous.  He has had chills and sweats during that time.  The last emesis was 6 hours ago.  He denies having eaten anything he can think of that was unusual or tasted wrong.  His only exposure he knows of is that he takes his son to a daycare and possibly 1 of the children at the daycare exposed him.  However his young son has not been ill.  There was no blood in the emesis.  He has had no diarrhea.  Appetite is poor.  [facility] 02/19/2018 07/11/2017 06/12/2017  Decreased Interest 0 1 1  Down, Depressed, Hopeless 0 1 1  PHQ - 2 Score 0 2 2  Altered sleeping - 0 1  Tired, decreased energy - 0 1  Change in appetite - 0 0  Feeling bad or failure about yourself  - 0 0  Trouble concentrating - 0 0  Moving slowly or fidgety/restless - 0 0  Suicidal thoughts - 0 0  PHQ-9 Score - 2 4    History Joseph Cabrera has a past medical history of Hypertension.   He has no past surgical history on file.   His family history includes Hypertension in his sister.He reports that he has quit smoking. His smokeless tobacco use includes chew. He reports that he drinks alcohol. He reports that he does not use drugs.    ROS Review of Systems  Constitutional: Positive for appetite change, chills, diaphoresis and fatigue.  HENT: Negative.   Eyes: Negative.   Respiratory: Negative.   Gastrointestinal: Negative for abdominal pain.  Musculoskeletal: Negative for arthralgias.  Neurological: Positive for headaches.    Objective:  BP 124/77   Pulse 71   Temp 98 F (36.7 C) (Oral)   Ht 5\' 10"  (1.778 m)   Wt 175 lb 4  oz (79.5 kg)   BMI 25.15 kg/m   BP Readings from Last 3 Encounters:  02/19/18 124/77  12/23/17 137/86  07/11/17 112/72    Wt Readings from Last 3 Encounters:  02/19/18 175 lb 4 oz (79.5 kg)  12/23/17 178 lb 3.2 oz (80.8 kg)  07/11/17 184 lb 6.4 oz (83.6 kg)     Physical Exam  Constitutional: He appears well-developed and well-nourished.  HENT:  Head: Normocephalic and atraumatic.  Right Ear: Tympanic membrane and external ear normal. No decreased hearing is noted.  Left Ear: Tympanic membrane and external ear normal. No decreased hearing is noted.  Mouth/Throat: No oropharyngeal exudate or posterior oropharyngeal erythema.  Eyes: Pupils are equal, round, and reactive to light.  Neck: Normal range of motion. Neck supple.  Cardiovascular: Normal rate and regular rhythm.  No murmur heard. Pulmonary/Chest: Breath sounds normal. No respiratory distress.  Abdominal: Soft. Bowel sounds are normal. He exhibits no mass. There is no tenderness.  Vitals reviewed.     Assessment & Plan:   Joseph Cabrera was seen today for emesis.  Diagnoses and all orders for this visit:  Viral gastroenteritis  Other orders -     ondansetron (ZOFRAN-ODT) 8 MG disintegrating tablet; Take 1 tablet (8 mg  total) by mouth every 8 (eight) hours as needed for nausea or vomiting.       I am having Joseph Cabrera start on ondansetron. I am also having him maintain his lisinopril and cetirizine.  Allergies as of 02/19/2018      Reactions   Shellfish Allergy Anaphylaxis      Medication List        Accurate as of 02/19/18  9:54 AM. Always use your most recent med list.          cetirizine 10 MG tablet Commonly known as:  ZYRTEC Take 10 mg by mouth daily.   lisinopril 10 MG tablet Commonly known as:  PRINIVIL,ZESTRIL Take 1 tablet (10 mg total) by mouth daily.   ondansetron 8 MG disintegrating tablet Commonly known as:  ZOFRAN-ODT Take 1 tablet (8 mg total) by mouth every 8 (eight) hours as  needed for nausea or vomiting.      Rest at home recommended.  However the patient says he does not have cyclic benefit so is going to try to work.  I suggested that may slow down his recovery.  Additionally he should follow clear liquids progressing to bland diet before going to regular diet as his symptoms resolve and she returns.  Follow-up: Return if symptoms worsen or fail to improve.  Mechele Claude, M.D.

## 2018-02-19 NOTE — Patient Instructions (Signed)
Clear liquids for the next 24 hours then bland foods for 48 hours.

## 2018-05-19 ENCOUNTER — Other Ambulatory Visit: Payer: Self-pay | Admitting: Physician Assistant

## 2018-05-19 DIAGNOSIS — I10 Essential (primary) hypertension: Secondary | ICD-10-CM

## 2018-06-19 NOTE — Progress Notes (Signed)
Subjective: CC: poison oak PCP: Joseph Loffler, PA-C ZOX:WRUEAV [name] is a 32 y.o. male presenting to clinic today for:  1. Poison oak Patient reports that he started having itching of his skin over her bilateral arms, abdomen and back over the last several days.  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  He has been using calamine lotion to affected areas but that symptoms have not responded as well as he had hoped.  He states that he often has to come in for a corticosteroid injection before symptoms will resolve.  He is here for this today.  Denies any shortness of breath, wheezes, facial swelling or involvement of the groin.   ROS: Per HPI  Allergies  Allergen Reactions  . Shellfish Allergy Anaphylaxis   Past Medical History:  Diagnosis Date  . Hypertension     Current Outpatient Medications:  .  cetirizine (ZYRTEC) 10 MG tablet, Take 10 mg by mouth daily., Disp: , Rfl:  .  lisinopril (PRINIVIL,ZESTRIL) 10 MG tablet, Take 1 tablet (10 mg total) by mouth daily., Disp: 90 tablet, Rfl: 3 .  ondansetron (ZOFRAN-ODT) 8 MG disintegrating tablet, Take 1 tablet (8 mg total) by mouth every 8 (eight) hours as needed for nausea or vomiting., Disp: 20 tablet, Rfl: 0 Social History   Socioeconomic History  . Marital status: Married    Spouse name: Not on file  . Number of children: Not on file  . Years of education: Not on file  . Highest education level: Not on file  Occupational History  . Not on file  Social Needs  . Financial resource strain: Not on file  . Food insecurity:    Worry: Not on file    Inability: Not on file  . Transportation needs:    Medical: Not on file    Non-medical: Not on file  Tobacco Use  . Smoking status: Former Games developer  . Smokeless tobacco: Current User    Types: Chew  Substance and Sexual Activity  . Alcohol use: Yes    Comment: occasional  . Drug use: No  . Sexual activity: Not on file  Lifestyle    . Physical activity:    Days per week: Not on file    Minutes per session: Not on file  . Stress: Not on file  Relationships  . Social connections:    Talks on phone: Not on file    Gets together: Not on file    Attends religious service: Not on file    Active member of club or organization: Not on file    Attends meetings of clubs or organizations: Not on file    Relationship status: Not on file  . Intimate partner violence:    Fear of current or ex partner: Not on file    Emotionally abused: Not on file    Physically abused: Not on file    Forced sexual activity: Not on file  Other Topics Concern  . Not on file  Social History Narrative  . Not on file   Family History  Problem Relation Age of Onset  . Hypertension Sister     Objective: Office vital signs reviewed. BP 130/88   Pulse 73   Temp 97.6 F (36.4 C) (Oral)   Ht 5\' 10"  (1.778 m)   Wt 174 lb (78.9 kg)   BMI 24.97 kg/m   Physical Examination:  General: Awake, alert, well nourished, No acute distress  HEENT: Normal, no facial swelling.    Throat: moist mucus membranes Pulm:  normal work of breathing on room air Extremities: warm, well perfused, No edema, cyanosis or clubbing; +2 pulses bilaterally MSK: normal gait and normal station Skin: dry; few fluid filled vesicles noted along wrists, abdomen and neck. Some healing excoriations. No evidence of secondary infection.  Assessment/ Plan: 32 y.o. male   1. Contact dermatitis due to poison oak No facial or genital involvement.  However lesions appreciated multiple areas of the body and are at this point refractory to OTC remedies.  Depo-Medrol 40 mg IM administered today.  Home care instructions reviewed.  Reasons for return discussed. Work note provided. Follow-up as needed. - methylPREDNISolone acetate (DEPO-MEDROL) injection 40 mg  Meds ordered this encounter  Medications  . methylPREDNISolone acetate (DEPO-MEDROL) injection 40 mg     Joseph IpAshly M  Gottschalk, DO [facility] [phone]

## 2018-06-20 ENCOUNTER — Encounter: Payer: Self-pay | Admitting: Family Medicine

## 2018-06-20 ENCOUNTER — Ambulatory Visit (INDEPENDENT_AMBULATORY_CARE_PROVIDER_SITE_OTHER): Payer: BLUE CROSS/BLUE SHIELD | Admitting: Family Medicine

## 2018-06-20 VITALS — BP 130/88 | HR 73 | Temp 97.6°F | Ht 70.0 in | Wt 174.0 lb

## 2018-06-20 DIAGNOSIS — L237 Allergic contact dermatitis due to plants, except food: Secondary | ICD-10-CM

## 2018-06-20 MED ORDER — METHYLPREDNISOLONE ACETATE 80 MG/ML IJ SUSP
40.0000 mg | Freq: Once | INTRAMUSCULAR | Status: AC
  Administered 2018-06-20: 40 mg via INTRAMUSCULAR

## 2018-06-20 NOTE — Patient Instructions (Signed)
Poison Oak Dermatitis Poison oak dermatitis is redness and soreness (inflammation) of the skin. It is caused by a chemical that is on the leaves of the poison oak plant. You may also have itching, a rash, and blisters. Symptoms often clear up in 1-2 weeks. You may get this condition by touching a poison oak plant. You can also get it by touching something that has the chemical on it. This may include animals or objects that have come in contact with the plant. Follow these instructions at home: General instructions  Take or apply over-the-counter and prescription medicines only as told by your doctor.  If you touch poison oak, wash your skin with soap and cold water right away.  Use hydrocortisone creams or calamine lotion as needed to help with itching.  Take oatmeal baths as needed. Use colloidal oatmeal. You can get this at a pharmacy or grocery store. Follow the instructions on the package.  Do not scratch or rub your skin.  While you have the rash, wash your clothes right after you wear them. Prevention   Know what poison oak looks like so you can avoid it. This plant has three leaves with flowering branches on a single stem. The leaves are fuzzy. They have a toothlike edge.  If you have touched poison oak, wash with soap and water right away. Be sure to wash under your fingernails.  When hiking or camping, wear long pants, a long-sleeved shirt, tall socks, and hiking boots. You can also use a lotion on your skin that helps to prevent contact with the chemical on the plant.  If you think that your clothes or outdoor gear came in contact with poison oak, rinse them off with a garden hose before you bring them inside your house. Contact a doctor if:  You have open sores in the rash area.  You have more redness, swelling, or pain in the affected area.  You have redness that spreads beyond the rash area.  You have fluid, blood, or pus coming from the affected area.  You have a  fever.  You have a rash over a large area of your body.  You have a rash on your eyes, mouth, or genitals.  Your rash does not improve after a few days. Get help right away if:  Your face swells or your eyes swell shut.  You have trouble breathing.  You have trouble swallowing. This information is not intended to replace advice given to you by your health care provider. Make sure you discuss any questions you have with your health care provider. Document Released: 12/15/2010 Document Revised: 04/19/2016 Document Reviewed: 04/20/2015 Elsevier Interactive Patient Education  2018 Elsevier Inc.  

## 2018-07-04 ENCOUNTER — Other Ambulatory Visit: Payer: Self-pay | Admitting: Physician Assistant

## 2018-07-04 ENCOUNTER — Telehealth: Payer: Self-pay | Admitting: Physician Assistant

## 2018-07-04 ENCOUNTER — Encounter: Payer: Self-pay | Admitting: Physician Assistant

## 2018-07-04 ENCOUNTER — Ambulatory Visit: Payer: BLUE CROSS/BLUE SHIELD | Admitting: Physician Assistant

## 2018-07-04 VITALS — BP 138/86 | HR 86 | Temp 97.6°F | Ht 70.0 in | Wt 172.0 lb

## 2018-07-04 DIAGNOSIS — K640 First degree hemorrhoids: Secondary | ICD-10-CM | POA: Diagnosis not present

## 2018-07-04 DIAGNOSIS — I10 Essential (primary) hypertension: Secondary | ICD-10-CM | POA: Diagnosis not present

## 2018-07-04 MED ORDER — HYDROCORTISONE ACE-PRAMOXINE 1-1 % RE FOAM: 1.0000 | Freq: Two times a day (BID) | RECTAL | 0 refills | Status: DC

## 2018-07-04 MED ORDER — LIDOCAINE 2 % EX GEL: 1.0000 "application " | Freq: Three times a day (TID) | CUTANEOUS | 0 refills | Status: DC

## 2018-07-04 MED ORDER — CIPROFLOXACIN HCL 500 MG PO TABS: 500.0000 mg | ORAL_TABLET | Freq: Two times a day (BID) | ORAL | 0 refills | Status: DC

## 2018-07-04 MED ORDER — HYDROCORTISONE ACETATE 25 MG RE SUPP: 25.0000 mg | Freq: Two times a day (BID) | RECTAL | 2 refills | Status: DC

## 2018-07-04 MED ORDER — LISINOPRIL 20 MG PO TABS: 20.0000 mg | ORAL_TABLET | Freq: Every day | ORAL | 1 refills | Status: DC

## 2018-07-04 NOTE — Telephone Encounter (Signed)
Ok to write note, I sent proctofoam HC to pharmacy

## 2018-07-04 NOTE — Patient Instructions (Signed)
In a few days you may receive a survey in the mail or online from [employer] regarding your visit with us today. Please take a moment to fill this out. Your feedback is very important to our whole office. It can help us better understand your needs as well as improve your experience and satisfaction. Thank you for taking your time to complete it. We care about you.  Hanan Mcwilliams, PA-C  

## 2018-07-04 NOTE — Telephone Encounter (Signed)
Work note can be picked up at the front desk.  New medication was sent in to his pharmacy that hopefully his insurance will cover.  Unable to reach patient by phone and he did not have a voicemail setup.

## 2018-07-04 NOTE — Progress Notes (Signed)
BP 138/86 (BP Location: Right Arm, Patient Position: Sitting)   Pulse 86   Temp 97.6 F (36.4 C) (Oral)   Ht 5\' 10"  (1.778 m)   Wt 172 lb (78 kg)   BMI 24.68 kg/m    Subjective:    Patient ID: Joseph Cabrera, male    DOB: [birth year], 32 y.o.   MRN: [id number]  HPI: Joseph Cabrera is a 32 y.o. male presenting on 07/04/2018 for Hemorrhoids (has had hemorrhoids since age 32. Has used preperation H but they are not resolving. Has increased lifting at work. ) This patient reports that he had hemorrhoids as early as age 89.  He is to be a wrestler.  He has never had surgery.  He started a new job where he is lifting a tremendous amount of weight.  Feels that they have gotten flared back up again.  He has tried over-the-counter medications without any relief.  He has some pain in the left buttock and into the sacral area.  It hurts when he sits down.  He has never had an infection before open or a fistula.  He reports that he is persistently having elevated blood pressure readings throughout the day.  In the mornings he will be 120s over 80s but it never goes any lower than that.  He states that sometimes it will even be 140s over 90 by the end of the day.  We have discussed increasing his medication and he will try that and let us know if it gives give 70 problems.  Past Medical History:  Diagnosis Date  . Hypertension    Relevant past medical, surgical, family and social history reviewed and updated as indicated. Interim medical history since our last visit reviewed. Allergies and medications reviewed and updated. DATA REVIEWED: CHART IN EPIC  Family History reviewed for pertinent findings.  Review of Systems  Constitutional: Negative.  Negative for appetite change and fatigue.  Eyes: Negative for pain and visual disturbance.  Respiratory: Negative.  Negative for cough, chest tightness, shortness of breath and wheezing.   Cardiovascular: Negative.  Negative for chest pain, palpitations  and leg swelling.  Gastrointestinal: Positive for anal bleeding and blood in stool. Negative for abdominal pain, diarrhea, nausea and vomiting.  Genitourinary: Negative.   Skin: Negative.  Negative for color change and rash.  Neurological: Negative.  Negative for weakness, numbness and headaches.  Psychiatric/Behavioral: Negative.     Allergies as of 07/04/2018      Reactions   Shellfish Allergy Anaphylaxis      Medication List        Accurate as of 07/04/18  6:07 PM. Always use your most recent med list.          ciprofloxacin 500 MG tablet Commonly known as:  CIPRO Take 1 tablet (500 mg total) by mouth 2 (two) times daily.   hydrocortisone-pramoxine rectal foam Commonly known as:  PROCTOFOAM-HC Place 1 applicator rectally 2 (two) times daily.   Lidocaine 2 % Gel Apply 1 application topically 3 (three) times daily.   lisinopril 20 MG tablet Commonly known as:  PRINIVIL,ZESTRIL Take 1 tablet (20 mg total) by mouth daily.          Objective:    BP 138/86 (BP Location: Right Arm, Patient Position: Sitting)   Pulse 86   Temp 97.6 F (36.4 C) (Oral)   Ht 5\' 10"  (1.778 m)   Wt 172 lb (78 kg)   BMI 24.68 kg/m   Allergies  Allergen Reactions  .  Shellfish Allergy Anaphylaxis    Wt Readings from Last 3 Encounters:  07/04/18 172 lb (78 kg)  06/20/18 174 lb (78.9 kg)  02/19/18 175 lb 4 oz (79.5 kg)    Physical Exam  Constitutional: He appears well-developed and well-nourished. No distress.  HENT:  Head: Normocephalic and atraumatic.  Eyes: Pupils are equal, round, and reactive to light. Conjunctivae and EOM are normal.  Cardiovascular: Normal rate, regular rhythm and normal heart sounds.  Pulmonary/Chest: Effort normal and breath sounds normal. No respiratory distress.  Abdominal: Normal appearance.  Rectal exam shows hemorrhoids in the external area.  There is some redness in the left buttock up toward the sacrum.  He is tender to the touch.  Skin: Skin is warm  and dry.  Psychiatric: He has a normal mood and affect. His behavior is normal.  Nursing note and vitals reviewed.   Results for orders placed or performed during the hospital encounter of 02/10/16  CBC with Differential  Result Value Ref Range   WBC 7.8 4.0 - 10.5 K/uL   RBC 5.48 4.22 - 5.81 MIL/uL   Hemoglobin 16.6 13.0 - 17.0 g/dL   HCT 62.1 30.8 - 65.7 %   MCV 88.1 78.0 - 100.0 fL   MCH 30.3 26.0 - 34.0 pg   MCHC 34.4 30.0 - 36.0 g/dL   RDW 84.6 96.2 - 95.2 %   Platelets 288 150 - 400 K/uL   Neutrophils Relative % 61 %   Neutro Abs 4.7 1.7 - 7.7 K/uL   Lymphocytes Relative 30 %   Lymphs Abs 2.4 0.7 - 4.0 K/uL   Monocytes Relative 7 %   Monocytes Absolute 0.6 0.1 - 1.0 K/uL   Eosinophils Relative 2 %   Eosinophils Absolute 0.2 0.0 - 0.7 K/uL   Basophils Relative 0 %   Basophils Absolute 0.0 0.0 - 0.1 K/uL  Basic metabolic panel  Result Value Ref Range   Sodium 138 135 - 145 mmol/L   Potassium 4.5 3.5 - 5.1 mmol/L   Chloride 102 101 - 111 mmol/L   CO2 30 22 - 32 mmol/L   Glucose, Bld 102 (H) 65 - 99 mg/dL   BUN 11 6 - 20 mg/dL   Creatinine, Ser 8.41 0.61 - 1.24 mg/dL   Calcium 9.7 8.9 - 32.4 mg/dL   GFR calc non Af Amer >60 >60 mL/min   GFR calc Af Amer >60 >60 mL/min   Anion gap 6 5 - 15      Assessment & Plan:   1. Grade I hemorrhoids - ciprofloxacin (CIPRO) 500 MG tablet; Take 1 tablet (500 mg total) by mouth 2 (two) times daily.  Dispense: 20 tablet; Refill: 0 - Lidocaine 2 % GEL; Apply 1 application topically 3 (three) times daily.  Dispense: 60 g; Refill: 0  2. Essential hypertension - lisinopril (PRINIVIL,ZESTRIL) 20 MG tablet; Take 1 tablet (20 mg total) by mouth daily.  Dispense: 90 tablet; Refill: 1   Continue all other maintenance medications as listed above.  Follow up plan: No follow-ups on file.  Educational handout given for hemorrhoids  Remus Loffler PA-C [facility] [address]  [place], [place]  [address] [phone]   07/04/2018, 6:07 PM

## 2018-07-04 NOTE — Telephone Encounter (Signed)
Please review and advise.

## 2018-07-09 ENCOUNTER — Other Ambulatory Visit: Payer: Self-pay | Admitting: Family Medicine

## 2018-07-09 ENCOUNTER — Telehealth: Payer: Self-pay

## 2018-07-09 MED ORDER — HYDROCORTISONE 2.5 % RE CREA: 1.0000 "application " | TOPICAL_CREAM | Freq: Two times a day (BID) | RECTAL | 0 refills | Status: DC

## 2018-07-09 NOTE — Telephone Encounter (Signed)
Proctosol HC sent to pharmacy

## 2018-07-09 NOTE — Telephone Encounter (Signed)
[facility] patient  Insurance denied Proctofoam  Must try two on formulary first  Proctosol HC cream hydrocortisone 2.5% topical cream with perineal applicator

## 2018-07-29 ENCOUNTER — Other Ambulatory Visit: Payer: Self-pay | Admitting: Physician Assistant

## 2018-07-29 ENCOUNTER — Telehealth: Payer: Self-pay | Admitting: Physician Assistant

## 2018-07-29 MED ORDER — PREDNISONE 10 MG (21) PO TBPK: ORAL_TABLET | ORAL | 0 refills | Status: DC

## 2018-07-29 NOTE — Telephone Encounter (Signed)
Patient aware.

## 2018-07-29 NOTE — Telephone Encounter (Signed)
What symptoms do you have? Poison oak  How long have you been sick? Was seen for this a few weeks back got steroid shot and it cleared up and now it is back again  Have you been seen for this problem? yes  If your provider decides to give you a prescription, which pharmacy would you like for it to be sent to? [facility]   Patient informed that this information will be sent to the clinical staff for review and that they should receive a follow up call.

## 2018-07-29 NOTE — Telephone Encounter (Signed)
Sent sterapred to pharmacy

## 2018-10-10 ENCOUNTER — Other Ambulatory Visit: Payer: Self-pay | Admitting: Physician Assistant

## 2018-10-10 DIAGNOSIS — I10 Essential (primary) hypertension: Secondary | ICD-10-CM

## 2019-01-20 ENCOUNTER — Other Ambulatory Visit: Payer: Self-pay | Admitting: Physician Assistant

## 2019-01-20 DIAGNOSIS — I10 Essential (primary) hypertension: Secondary | ICD-10-CM

## 2019-01-21 NOTE — Telephone Encounter (Signed)
Last seen 06/20/18

## 2019-03-18 DIAGNOSIS — K610 Anal abscess: Secondary | ICD-10-CM | POA: Insufficient documentation

## 2019-04-28 ENCOUNTER — Other Ambulatory Visit: Payer: Self-pay | Admitting: Physician Assistant

## 2019-04-28 DIAGNOSIS — I10 Essential (primary) hypertension: Secondary | ICD-10-CM

## 2019-06-30 ENCOUNTER — Ambulatory Visit (INDEPENDENT_AMBULATORY_CARE_PROVIDER_SITE_OTHER): Payer: BLUE CROSS/BLUE SHIELD | Admitting: Physician Assistant

## 2019-06-30 ENCOUNTER — Encounter: Payer: Self-pay | Admitting: Physician Assistant

## 2019-06-30 DIAGNOSIS — I10 Essential (primary) hypertension: Secondary | ICD-10-CM

## 2019-06-30 MED ORDER — LISINOPRIL 20 MG PO TABS: 20.0000 mg | ORAL_TABLET | Freq: Every day | ORAL | 3 refills | Status: DC

## 2019-06-30 MED ORDER — CETIRIZINE HCL 10 MG PO TABS: 10.0000 mg | ORAL_TABLET | Freq: Every day | ORAL | 11 refills | Status: AC

## 2019-06-30 NOTE — Progress Notes (Signed)
    Telephone visit  Subjective: [id number] PCP: Terald Sleeper, PA-C YIY:UWCNPS Dy is a 33 y.o. male calls for telephone consult today. Patient provides verbal consent for consult held via phone.  Patient is identified with 2 separate identifiers.  At this time the entire area is on COVID-19 social distancing and stay home orders are in place.  Patient is of higher risk and therefore we are performing this by a virtual method.  Location of patient: home Location of provider: HOME Others present for call: no  This patient is having a recheck of hypertension.  The patient states that his blood pressures have been good throughout any time that he has checked it.  Always in the 120s over 3s.  He denies any chest pain, shortness of breath, edema.  He will come from some fasting labs in the near future.  An order has been placed.  He states that overall he is doing well not having any difficulties.  ROS: Per HPI  Allergies  Allergen Reactions  . Shellfish Allergy Anaphylaxis   Past Medical History:  Diagnosis Date  . Hypertension     Current Outpatient Medications:  .  cetirizine (ZYRTEC) 10 MG tablet, Take 1 tablet (10 mg total) by mouth daily., Disp: 30 tablet, Rfl: 11 .  lisinopril (ZESTRIL) 20 MG tablet, Take 1 tablet (20 mg total) by mouth daily. (Needs to be seen before next refill), Disp: 90 tablet, Rfl: 3  Assessment/ Plan: 33 y.o. male   1. Essential hypertension - lisinopril (ZESTRIL) 20 MG tablet; Take 1 tablet (20 mg total) by mouth daily. (Needs to be seen before next refill)  Dispense: 90 tablet; Refill: 3 - CBC with Differential/Platelet; Future - CMP14+EGFR; Future - Lipid panel; Future   No follow-ups on file.  Continue all other maintenance medications as listed above.  Start time: 3:27 PM End time: 3:38 PM Meds ordered this encounter  Medications  . lisinopril (ZESTRIL) 20 MG tablet    Sig: Take 1 tablet (20 mg total) by mouth daily. (Needs  to be seen before next refill)    Dispense:  90 tablet    Refill:  3    Order Specific Question:   Supervising Provider    Answer:   Janora Norlander [[id number]]  . cetirizine (ZYRTEC) 10 MG tablet    Sig: Take 1 tablet (10 mg total) by mouth daily.    Dispense:  30 tablet    Refill:  11    Order Specific Question:   Supervising Provider    Answer:   Janora Norlander [[id number]]    Particia Nearing PA-C [place] [phone]

## 2019-07-24 ENCOUNTER — Other Ambulatory Visit: Payer: Self-pay

## 2019-07-27 ENCOUNTER — Ambulatory Visit: Payer: 59 | Admitting: Physician Assistant

## 2019-07-27 ENCOUNTER — Encounter: Payer: Self-pay | Admitting: Physician Assistant

## 2019-07-27 VITALS — BP 115/80 | HR 84 | Temp 98.7°F | Ht 70.0 in | Wt 188.2 lb

## 2019-07-27 DIAGNOSIS — L089 Local infection of the skin and subcutaneous tissue, unspecified: Secondary | ICD-10-CM

## 2019-07-27 DIAGNOSIS — L723 Sebaceous cyst: Secondary | ICD-10-CM | POA: Diagnosis not present

## 2019-07-27 MED ORDER — DOXYCYCLINE HYCLATE 100 MG PO TABS: 100.0000 mg | ORAL_TABLET | Freq: Two times a day (BID) | ORAL | 11 refills | Status: DC

## 2019-07-28 ENCOUNTER — Other Ambulatory Visit: Payer: Self-pay | Admitting: Physician Assistant

## 2019-07-28 ENCOUNTER — Telehealth: Payer: Self-pay | Admitting: Physician Assistant

## 2019-07-28 MED ORDER — CIPROFLOXACIN HCL 500 MG PO TABS
500.0000 mg | ORAL_TABLET | Freq: Two times a day (BID) | ORAL | 0 refills | Status: DC
Start: 2019-07-28 — End: 2020-06-27

## 2019-07-28 NOTE — Telephone Encounter (Signed)
Patient aware and he states that it is getting worse and he states that he can hardly walk. Please advise

## 2019-07-28 NOTE — Progress Notes (Signed)
cipro

## 2019-07-28 NOTE — Telephone Encounter (Signed)
He was started on doxycycline yesterday, this is not 1 that is typically used for urinary tract infection.  But rather than putting him on 2 antibiotics I would like him to continue the doxycycline and see if things calm down.  If they do not come down by tomorrow please call and I will send another antibiotic in for this.  Please try to drink a lot of water to flush the urinary tract.

## 2019-07-28 NOTE — Telephone Encounter (Signed)
Patient aware.

## 2019-07-28 NOTE — Telephone Encounter (Signed)
Hold the doxycycline for now.  A prescription for ciprofloxacin 500 mg 1 twice daily for 10 days was sent to the pharmacy.

## 2019-07-28 NOTE — Telephone Encounter (Signed)
Patient forgot to mention yesterday he thinks he may have a bladder infection.  He is having frequent urination, sensation of full bladder, pain in abdomen below waist line.  Patient uses Haematologist.

## 2019-07-29 ENCOUNTER — Telehealth: Payer: Self-pay | Admitting: Physician Assistant

## 2019-07-29 NOTE — Telephone Encounter (Signed)
Yes can have a note.  He can also get over-the-counter medications such as Cystospaz or Azo-Standard to help with bladder pain.  He can take it for 2 days.  There will be a change in his urine to a yellow-orange color.

## 2019-07-29 NOTE — Telephone Encounter (Signed)
Patient aware, he will send his wife to pick up note.

## 2019-08-03 NOTE — Progress Notes (Signed)
BP 115/80   Pulse 84   Temp 98.7 F (37.1 C) (Temporal)   Ht 5\' 10"  (1.778 m)   Wt 188 lb 3.2 oz (85.4 kg)   BMI 27.00 kg/m    Subjective:    Patient ID: Joseph Cabrera, male    DOB: [birth year], 33 y.o.   MRN: [id number]  HPI: Joseph Cabrera is a 33 y.o. male presenting on 07/27/2019 for Recurrent Skin Infections (left gluteal)  Patient comes in for recurrent skin infections.  He is  Abscess on the left labial area.  He denies any injury to the area.  He had infection in April of this year that required it to be incised and drained.  However no one removed any type of sac.  He was only given oral antibiotics.  He did seem to clear up.  However it has come back.  He is coming in today because it is just getting started in with like to get ahead of it up or gets very bad.  He would also like referral to dermatology to have it completely removed.  Past Medical History:  Diagnosis Date  . Hypertension    Relevant past medical, surgical, family and social history reviewed and updated as indicated. Interim medical history since our last visit reviewed. Allergies and medications reviewed and updated. DATA REVIEWED: CHART IN EPIC  Family History reviewed for pertinent findings.  Review of Systems  Constitutional: Negative.  Negative for appetite change and fatigue.  Eyes: Negative for pain and visual disturbance.  Respiratory: Negative.  Negative for cough, chest tightness, shortness of breath and wheezing.   Cardiovascular: Negative.  Negative for chest pain, palpitations and leg swelling.  Gastrointestinal: Negative.  Negative for abdominal pain, diarrhea, nausea and vomiting.  Genitourinary: Negative.   Skin: Positive for color change and wound. Negative for rash.  Neurological: Negative.  Negative for weakness, numbness and headaches.  Psychiatric/Behavioral: Negative.     Allergies as of 07/27/2019      Reactions   Shellfish Allergy Anaphylaxis      Medication List        Accurate as of July 27, 2019 11:59 PM. If you have any questions, ask your nurse or doctor.        cetirizine 10 MG tablet Commonly known as: ZYRTEC Take 1 tablet (10 mg total) by mouth daily.   doxycycline 100 MG tablet Commonly known as: VIBRA-TABS Take 1 tablet (100 mg total) by mouth 2 (two) times daily. After 10 days, stay on 1 daily Started by: Remus LofflerAngel S Wakisha Alberts, PA-C   lisinopril 20 MG tablet Commonly known as: ZESTRIL Take 1 tablet (20 mg total) by mouth daily. (Needs to be seen before next refill)          Objective:    BP 115/80   Pulse 84   Temp 98.7 F (37.1 C) (Temporal)   Ht 5\' 10"  (1.778 m)   Wt 188 lb 3.2 oz (85.4 kg)   BMI 27.00 kg/m   Allergies  Allergen Reactions  . Shellfish Allergy Anaphylaxis    Wt Readings from Last 3 Encounters:  07/27/19 188 lb 3.2 oz (85.4 kg)  07/04/18 172 lb (78 kg)  06/20/18 174 lb (78.9 kg)    Physical Exam Vitals signs and nursing note reviewed.  Constitutional:      General: He is not in acute distress.    Appearance: He is well-developed.  HENT:     Head: Normocephalic and atraumatic.  Eyes:  Conjunctiva/sclera: Conjunctivae normal.     Pupils: Pupils are equal, round, and reactive to light.  Cardiovascular:     Rate and Rhythm: Normal rate and regular rhythm.     Heart sounds: Normal heart sounds.  Pulmonary:     Effort: Pulmonary effort is normal. No respiratory distress.     Breath sounds: Normal breath sounds.  Skin:    General: Skin is warm and dry.       Psychiatric:        Behavior: Behavior normal.         Assessment & Plan:   1. Infected sebaceous cyst - doxycycline (VIBRA-TABS) 100 MG tablet; Take 1 tablet (100 mg total) by mouth 2 (two) times daily. After 10 days, stay on 1 daily  Dispense: 40 tablet; Refill: 11 - Ambulatory referral to Dermatology   Continue all other maintenance medications as listed above.  Follow up plan: No follow-ups on file.  Educational handout given  for cyst  Terald Sleeper PA-C [facility] [address]  [place], [place] [address] [phone]   08/03/2019, 7:35 PM

## 2020-06-27 ENCOUNTER — Ambulatory Visit (INDEPENDENT_AMBULATORY_CARE_PROVIDER_SITE_OTHER): Payer: Managed Care, Other (non HMO)

## 2020-06-27 ENCOUNTER — Encounter: Payer: Self-pay | Admitting: Family Medicine

## 2020-06-27 ENCOUNTER — Other Ambulatory Visit: Payer: Self-pay

## 2020-06-27 ENCOUNTER — Telehealth: Payer: Managed Care, Other (non HMO) | Admitting: Family Medicine

## 2020-06-27 ENCOUNTER — Ambulatory Visit (INDEPENDENT_AMBULATORY_CARE_PROVIDER_SITE_OTHER): Payer: Managed Care, Other (non HMO) | Admitting: Family Medicine

## 2020-06-27 ENCOUNTER — Telehealth: Payer: Self-pay | Admitting: Family Medicine

## 2020-06-27 VITALS — BP 123/78 | HR 57 | Temp 97.6°F | Ht 70.0 in | Wt 198.8 lb

## 2020-06-27 DIAGNOSIS — M5412 Radiculopathy, cervical region: Secondary | ICD-10-CM

## 2020-06-27 MED ORDER — TIZANIDINE HCL 4 MG PO TABS: 2.0000 mg | ORAL_TABLET | Freq: Three times a day (TID) | ORAL | 0 refills | Status: DC | PRN

## 2020-06-27 MED ORDER — MELOXICAM 15 MG PO TABS: 15.0000 mg | ORAL_TABLET | Freq: Every day | ORAL | 1 refills | Status: DC | PRN

## 2020-06-27 NOTE — Telephone Encounter (Signed)
Karen Chafe has question about please call back tiZANidine (ZANAFLEX) 4 MG tablet and cipro--medication interaction.

## 2020-06-27 NOTE — Telephone Encounter (Signed)
Returned call- pharmacy states that there is a drug interaction with cipro and tizanidine.  States patient is still on Cipro.  Please advise.

## 2020-06-27 NOTE — Patient Instructions (Signed)
You have prescribed a nonsteroidal anti-inflammatory drug (NSAID) today. This will help with your pain and inflammation. Please do not take any other NSAIDs (ibuprofen/Motrin/Advil, naproxen/Aleve, meloxicam/Mobic, Voltaren/diclofenac). Please make sure to eat a meal when taking this medication.   Caution:  If you have a history of acid reflux/indigestion, I recommend that you take an antacid (such as Prilosec, Prevacid) daily while on the NSAID.  If you have a history of bleeding disorder, gastric ulcer, are on a blood thinner (like warfarin/Coumadin, Xarelto, Eliquis, etc) please do not take NSAID.  If you have ever had a heart attack, you should not take NSAIDs.  Will plan for MRI if symptoms are not getting better on the medication I have prescribed.  I'd like to see you back in 2 weeks for recheck if no better.

## 2020-06-27 NOTE — Progress Notes (Signed)
Subjective: CC: Neck pain PCP: Raliegh Ip, DO Joseph Cabrera is a 34 y.o. male presenting to clinic today for:  1. Neck pain patient reports about a 1 week history of neck pain. This started when he was lifting a box above shoulder level. He reports tingling and occasional numbness of the right upper extremity. The symptoms came to ahead this morning. Pain does radiate down the right upper extremity into the elbow. He has been using Aleve and Tylenol but this is not helping. No history of neck or shoulder injury or surgeries.   ROS: Per HPI  Allergies  Allergen Reactions  . Shellfish Allergy Anaphylaxis   Past Medical History:  Diagnosis Date  . Hypertension     Current Outpatient Medications:  .  cetirizine (ZYRTEC) 10 MG tablet, Take 1 tablet (10 mg total) by mouth daily., Disp: 30 tablet, Rfl: 11 .  doxycycline (VIBRA-TABS) 100 MG tablet, Take 1 tablet (100 mg total) by mouth 2 (two) times daily. After 10 days, stay on 1 daily, Disp: 40 tablet, Rfl: 11 .  lisinopril (ZESTRIL) 20 MG tablet, Take 1 tablet (20 mg total) by mouth daily. (Needs to be seen before next refill), Disp: 90 tablet, Rfl: 3 .  ciprofloxacin (CIPRO) 500 MG tablet, Take 1 tablet (500 mg total) by mouth 2 (two) times daily. (Patient not taking: Reported on 06/27/2020), Disp: 20 tablet, Rfl: 0 Social History   Socioeconomic History  . Marital status: Married    Spouse name: Not on file  . Number of children: Not on file  . Years of education: Not on file  . Highest education level: Not on file  Occupational History  . Not on file  Tobacco Use  . Smoking status: Former Games developer  . Smokeless tobacco: Current User    Types: Chew  Vaping Use  . Vaping Use: Never used  Substance and Sexual Activity  . Alcohol use: Yes    Comment: occasional  . Drug use: No  . Sexual activity: Not on file  Other Topics Concern  . Not on file  Social History Narrative  . Not on file   Social Determinants of  Health   Financial Resource Strain:   . Difficulty of Paying Living Expenses:   Food Insecurity:   . Worried About Programme researcher, broadcasting/film/video in the Last Year:   . Barista in the Last Year:   Transportation Needs:   . Freight forwarder (Medical):   Marland Kitchen Lack of Transportation (Non-Medical):   Physical Activity:   . Days of Exercise per Week:   . Minutes of Exercise per Session:   Stress:   . Feeling of Stress :   Social Connections:   . Frequency of Communication with Friends and Family:   . Frequency of Social Gatherings with Friends and Family:   . Attends Religious Services:   . Active Member of Clubs or Organizations:   . Attends Banker Meetings:   Marland Kitchen Marital Status:   Intimate Partner Violence:   . Fear of Current or Ex-Partner:   . Emotionally Abused:   Marland Kitchen Physically Abused:   . Sexually Abused:    Family History  Problem Relation Age of Onset  . Hypertension Sister     Objective: Office vital signs reviewed. BP 123/78   Pulse (!) 57   Temp 97.6 F (36.4 C)   Ht 5\' 10"  (1.778 m)   Wt 198 lb 12.8 oz (90.2 kg)   SpO2  96%   BMI 28.52 kg/m   Physical Examination:  General: Awake, alert, No acute distress MSK:  C-spine: Limited active range of motion in extension. He has full active range of motion in flexion and rotation to the left. Minimal reduction to the right. No midline tenderness palpation. He does have tenderness palpation along the paraspinal muscles and trapezius on the right. There are no palpable bony abnormalities. Negative Spurling. He has limited active range of motion of the right upper extremity with pain induced at about 90 degrees. Neuro: 5 out of 5 upper extremity strength. Light touch and station grossly intact.  Assessment/ Plan: 34 y.o. 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 Instead have placed him on an oral NSAID and muscle relaxer. I have written out the remainder of the week. X-ray was obtained and Personal review of xray shows degenerative changes throughout the lower C spine but appears worst at C5-6.  Disc spaces are narrowed.  Likely would benefit from MRI.  However, will hold off on this to see the response to the above therapies. I would like to see him back in 2 weeks, sooner if needed.   Orders Placed This Encounter  Procedures  . DG Cervical Spine Complete    Standing Status:   Future    Standing Expiration Date:   06/27/2021    Order Specific Question:   Reason for Exam (SYMPTOM  OR DIAGNOSIS REQUIRED)    Answer:   cervical radiculopathy    Order Specific Question:   Preferred imaging location?    Answer:   Internal    Order Specific Question:   Radiology Contrast Protocol - do NOT remove file path    Answer:   [email or web address]   No orders of the defined types were placed in this encounter.    Raliegh Ip, DO [facility] [phone]

## 2020-06-27 NOTE — Telephone Encounter (Signed)
This will probably be the case for all muscle relaxants.  Ok to use NSAID and heat only.  I did not realize he was still on cipro. Dc Tizanidine

## 2020-06-27 NOTE — Telephone Encounter (Signed)
Patient aware and verbalizes understanding. 

## 2020-07-12 ENCOUNTER — Ambulatory Visit: Payer: Managed Care, Other (non HMO) | Admitting: Family Medicine

## 2020-07-13 ENCOUNTER — Encounter: Payer: Self-pay | Admitting: Family Medicine

## 2020-07-18 ENCOUNTER — Telehealth: Payer: Self-pay | Admitting: Family Medicine

## 2020-07-18 DIAGNOSIS — I10 Essential (primary) hypertension: Secondary | ICD-10-CM

## 2020-07-18 NOTE — Telephone Encounter (Signed)
Pt is aware and future order for CMP placed and he states he will come in tomorrow for labs.

## 2020-07-18 NOTE — Telephone Encounter (Signed)
Pt hasn't had labs since 01/2016 and hasn't been seen for htn since 07/2019-ok to refill?

## 2020-07-18 NOTE — Telephone Encounter (Signed)
  Prescription Request  07/18/2020  What is the name of the medication or equipment? lisinopril (ZESTRIL) 20 MG tablet    Have you contacted your pharmacy to request a refill? (if applicable) yes  Which pharmacy would you like this sent to? [facility]   Patient notified that their request is being sent to the clinical staff for review and that they should receive a response within 2 business days.

## 2020-07-18 NOTE — Telephone Encounter (Signed)
Needs labs.  Please order CMP.

## 2020-07-19 ENCOUNTER — Other Ambulatory Visit: Payer: Self-pay

## 2020-07-19 ENCOUNTER — Other Ambulatory Visit: Payer: Managed Care, Other (non HMO)

## 2020-07-19 DIAGNOSIS — I10 Essential (primary) hypertension: Secondary | ICD-10-CM

## 2020-07-20 LAB — CMP14+EGFR
ALT: 14 IU/L (ref 0–44)
AST: 16 IU/L (ref 0–40)
Albumin/Globulin Ratio: 1.8 (ref 1.2–2.2)
Albumin: 4.6 g/dL (ref 4.0–5.0)
Alkaline Phosphatase: 53 IU/L (ref 48–121)
BUN/Creatinine Ratio: 10 (ref 9–20)
BUN: 9 mg/dL (ref 6–20)
Bilirubin Total: 0.3 mg/dL (ref 0.0–1.2)
CO2: 25 mmol/L (ref 20–29)
Calcium: 9.6 mg/dL (ref 8.7–10.2)
Chloride: 100 mmol/L (ref 96–106)
Creatinine, Ser: 0.91 mg/dL (ref 0.76–1.27)
GFR calc Af Amer: 129 mL/min/{1.73_m2} (ref >59)
GFR calc non Af Amer: 112 mL/min/{1.73_m2} (ref >59)
Globulin, Total: 2.5 g/dL (ref 1.5–4.5)
Glucose: 52 mg/dL — ABNORMAL LOW (ref 65–99)
Potassium: 4.5 mmol/L (ref 3.5–5.2)
Sodium: 138 mmol/L (ref 134–144)
Total Protein: 7.1 g/dL (ref 6.0–8.5)

## 2020-07-21 ENCOUNTER — Other Ambulatory Visit: Payer: Self-pay | Admitting: Family Medicine

## 2020-07-21 ENCOUNTER — Other Ambulatory Visit: Payer: Self-pay

## 2020-07-21 ENCOUNTER — Telehealth: Payer: Self-pay | Admitting: Family Medicine

## 2020-07-21 DIAGNOSIS — I10 Essential (primary) hypertension: Secondary | ICD-10-CM

## 2020-07-21 MED ORDER — LISINOPRIL 20 MG PO TABS: 20.0000 mg | ORAL_TABLET | Freq: Every day | ORAL | 1 refills | Status: DC

## 2020-07-21 NOTE — Telephone Encounter (Signed)
This has already been addressed, rx was sent when nurse called to go over test results.Will close encounter.

## 2020-07-21 NOTE — Telephone Encounter (Signed)
  Prescription Request  07/21/2020  What is the name of the medication or equipment? Lisinopril 20 mg. Patient had labs to check for blood pressure medicine and hadn't heard anything. Patient ran out yesterday  Have you contacted your pharmacy to request a refill? (if applicable) YES  Which pharmacy would you like this sent to? [facility] in [place]   Patient notified that their request is being sent to the clinical staff for review and that they should receive a response within 2 business days.

## 2020-08-03 ENCOUNTER — Ambulatory Visit: Payer: Managed Care, Other (non HMO) | Admitting: Family Medicine

## 2020-08-08 ENCOUNTER — Encounter: Payer: Self-pay | Admitting: Family Medicine

## 2020-09-02 ENCOUNTER — Ambulatory Visit (INDEPENDENT_AMBULATORY_CARE_PROVIDER_SITE_OTHER): Payer: Managed Care, Other (non HMO) | Admitting: Family Medicine

## 2020-09-02 DIAGNOSIS — J069 Acute upper respiratory infection, unspecified: Secondary | ICD-10-CM | POA: Diagnosis not present

## 2020-09-02 NOTE — Patient Instructions (Signed)
Respiratory Syncytial Virus, Adult Respiratory syncytial virus (RSV) infection is an infection caused by RSV, a common virus. This virus is similar to viruses that cause the common cold and the flu. RSV infection can affect the nose, throat, windpipe, and lungs (respiratory system). When the infection is severe, it can cause:  Bronchiolitis. This condition causes inflammation of the air passages in the lungs (bronchioles).  Pneumonia. This condition causes inflammation of the air sacs in the lungs. RSV infection spreads from person to person (is contagious) through droplets from coughs and sneezes (respiratory secretions). This condition is rarely serious when it occurs in adults. What are the causes? This condition is caused by contact with the RSV. This can happen by:  Breathing respiratory secretions that fly through the air.  Touching something that has been exposed to the virus (is contaminated) and then touching your mouth, nose, or eyes.  Coming in close contact with someone who has this infection. This may happen if you: ? Hug or kiss. ? Shake or hold hands. ? Eat or drink using the same dishes or utensils. What increases the risk? The following factors may make you more likely to develop this condition:  Being 65 years of age or older.  Having certain health conditions, including: ? A long-term (chronic) lung condition, such as chronic obstructive pulmonary disease (COPD). ? An immune system that is weak. This is your body's defense system. ? Down syndrome. ? Heart disease.  Working in a hospital or other health care facility.  Living in a long-term health care facility. RVS infections are most common from the months of November to April. But they can happen any time of year. What are the signs or symptoms? Symptoms of this condition include:  Having a runny nose.  Coughing. You may have a cough that brings up mucus (productive cough).  Sneezing.  Having a  fever.  Wanting to eat less than usual.  Breathing loudly (wheezing).  Having shortness of breath.  Having fluid build up in the lungs (respiratory distress). How is this diagnosed? This condition may be diagnosed based on:  Your symptoms.  Your medical history.  A physical exam.  A chest X-ray to rule out pneumonia.  Blood tests or tests of mucus from your lungs (sputum). These tests may be done for older adults.  A test of a sample of your respiratory secretions. How is this treated? In most cases, the RSV infection will go away after 1-2 weeks of caring for yourself at home.  Sometimes, RSV infection is severe and causes pneumonia. If you develop pneumonia, you may need to be treated in the hospital. You may be given:  Oxygen therapy.  Antibiotic medicine.  Medicines to open your bronchioles (bronchodilators). Follow these instructions at home: Medicines  Take over-the-counter and prescription medicines only as told by your health care provider.  If you were prescribed an antibiotic medicine, take it as told by your health care provider. Do not stop using the antibiotic even if you start to feel better. Lifestyle  Eat a healthy diet.  Do not drink alcohol.  Do not use any products that contain nicotine or tobacco, such as cigarettes, e-cigarettes, and chewing tobacco. If you need help quitting, ask your health care provider.  Rest at home until your symptoms go away.  Return to your normal activities as told by your health care provider. Ask your health care provider what activities are safe for you. General instructions  Drink enough fluid to keep your   urine pale yellow.  Gargle with a salt-water mixture 3-4 times a day or as needed. To make a salt-water mixture, completely dissolve -1 tsp (3-6 g) of salt in 1 cup (237 mL) of warm water.  Keep all follow-up visits as told by your health care provider. This is important. How is this prevented? To prevent  catching and spreading RSV:  Wash your hands often with soap and water. If soap and water are not available, use hand sanitizer. This liquid kills germs. Do not touch your face without first cleaning your hands.  Stay home if you have symptoms of the common cold or the flu.  Cover your nose and mouth when you cough or sneeze.  Avoid large groups of people.  Keep a safe distance from people who are coughing or sneezing. Contact a health care provider if:  Your symptoms get worse or have not changed after 2 weeks.  You have: ? A fever. ? Hot flashes, sweating, or chills that keep happening. ? A cough that brings up much more mucus than usual. ? A cough that brings up blood. ? Respiratory distress that gets worse.  You feel: ? Very tired (lethargic). ? Confused. Get help right away if you have:  Respiratory distress that becomes severe.  Loss of consciousness. These symptoms may represent a serious problem that is an emergency. Do not wait to see if the symptoms will go away. Get medical help right away. Call your local emergency services (911 in the [place] Do not drive yourself to the hospital. This information is not intended to replace advice given to you by your health care provider. Make sure you discuss any questions you have with your health care provider. Document Revised: 11/14/2018 Document Reviewed: 04/24/2016 Elsevier Patient Education  2020 Elsevier Inc.  

## 2020-09-02 NOTE — Progress Notes (Signed)
Telephone visit  Subjective: CC: RSV? PCP: Raliegh Ip, DO [id number] Clock is a 34 y.o. male calls for telephone consult today. Patient provides verbal consent for consult held via phone.  Due to COVID-19 pandemic this visit was conducted virtually. This visit type was conducted due to national recommendations for restrictions regarding the COVID-19 Pandemic (e.g. social distancing, sheltering in place) in an effort to limit this patient's exposure and mitigate transmission in our community. All issues noted in this document were discussed and addressed.  A physical exam was not performed with this format.   Location of patient: home Location of provider: [facility] Others present for call: none  1. ?RSV Patient reports that his daughter who was diagnosed with possible RSV.  He reports cough and congestion. No fever.  He called out from work due to severe cough.  Not using any meds yet.  He is not vaccinated against COVID.   ROS: Per HPI  Allergies  Allergen Reactions  . Shellfish Allergy Anaphylaxis   Past Medical History:  Diagnosis Date  . Hypertension     Current Outpatient Medications:  .  cetirizine (ZYRTEC) 10 MG tablet, Take 1 tablet (10 mg total) by mouth daily., Disp: 30 tablet, Rfl: 11 .  doxycycline (VIBRA-TABS) 100 MG tablet, Take 1 tablet (100 mg total) by mouth 2 (two) times daily. After 10 days, stay on 1 daily, Disp: 40 tablet, Rfl: 11 .  lisinopril (ZESTRIL) 20 MG tablet, Take 1 tablet (20 mg total) by mouth daily. (Needs to be seen before next refill), Disp: 90 tablet, Rfl: 1 .  meloxicam (MOBIC) 15 MG tablet, Take 1 tablet (15 mg total) by mouth daily as needed for pain., Disp: 30 tablet, Rfl: 1 .  tiZANidine (ZANAFLEX) 4 MG tablet, Take 0.5-1 tablets (2-4 mg total) by mouth every 8 (eight) hours as needed for muscle spasms., Disp: 30 tablet, Rfl: 0  Pulm: intermittently coughing. Does not sound dyspneic  Assessment/ Plan: 34 y.o. male   1. URI with  cough and congestion We discussed that likely this is RSV and that it typically presents as a cold in adults.  I do not expect him to have any severe symptoms.  I have recommended that he start OTC cough and cold medications.  Monitor for other concerning signs and symptoms.  Work note provided.  Handout provided.  I offered Covid testing since he is not vaccinated but he declined this.  He may return to work as per his office's protocol   Start time: 8:50 am End time: 8:55am  Total time spent on patient care (including telephone call/ virtual visit): 10 minutes  Lola Lofaro Hulen Skains, DO [facility] [phone]

## 2021-01-30 IMAGING — DX DG CERVICAL SPINE COMPLETE 4+V
5 series · 5 of 5 positions shown · non-contrast
Comparison: 10/24/2004

CLINICAL DATA: Neck pain and radiculopathy

EXAM:
CERVICAL SPINE - COMPLETE 4+ VIEW

[c-spine lat]
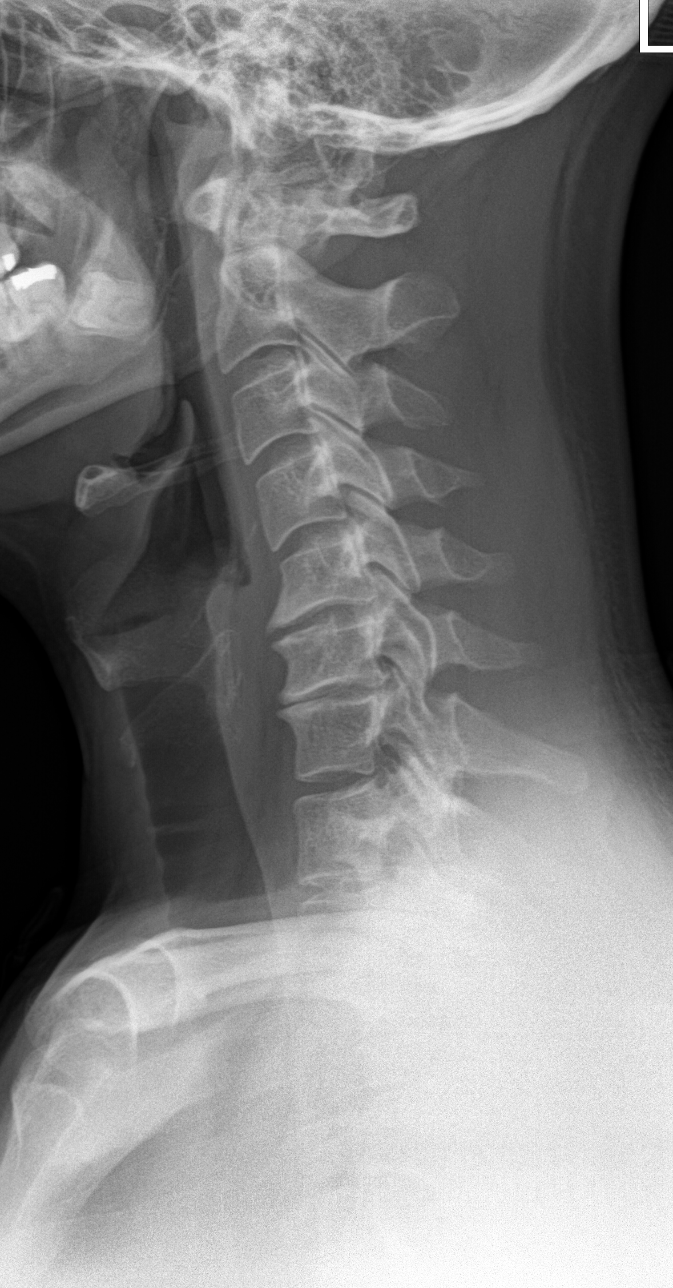

[c-spine obl (1 of 2)]
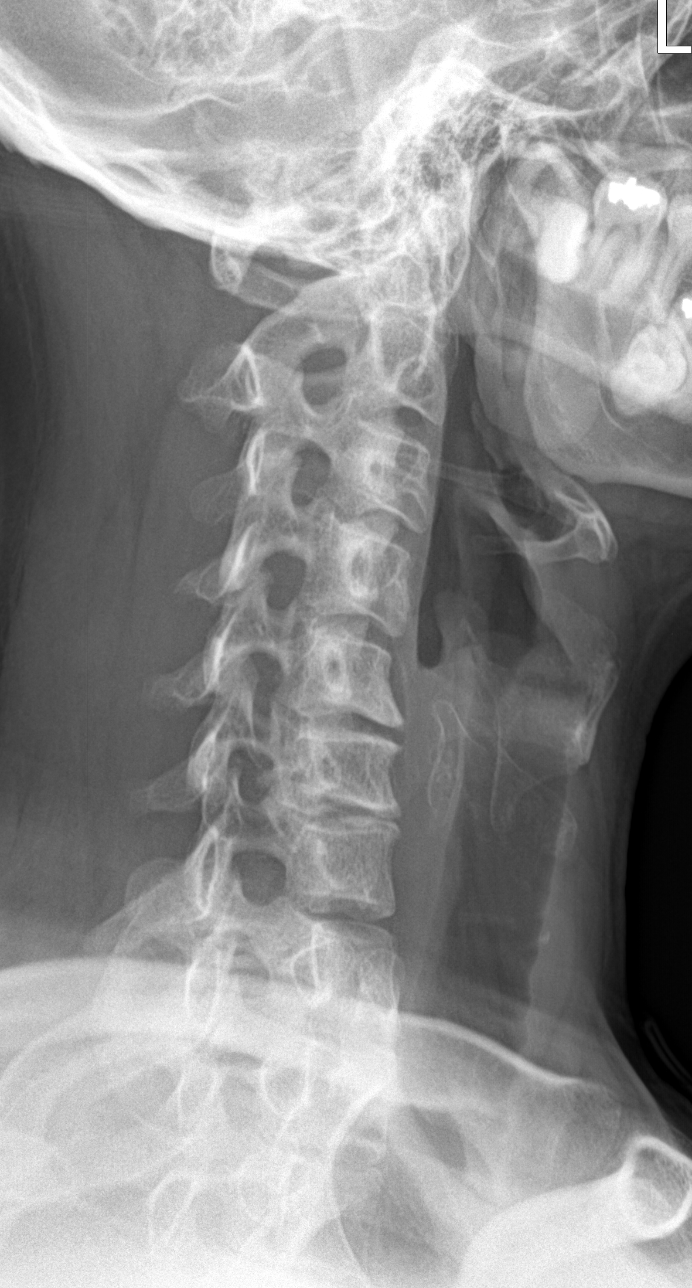

[c-spine obl (2 of 2)]
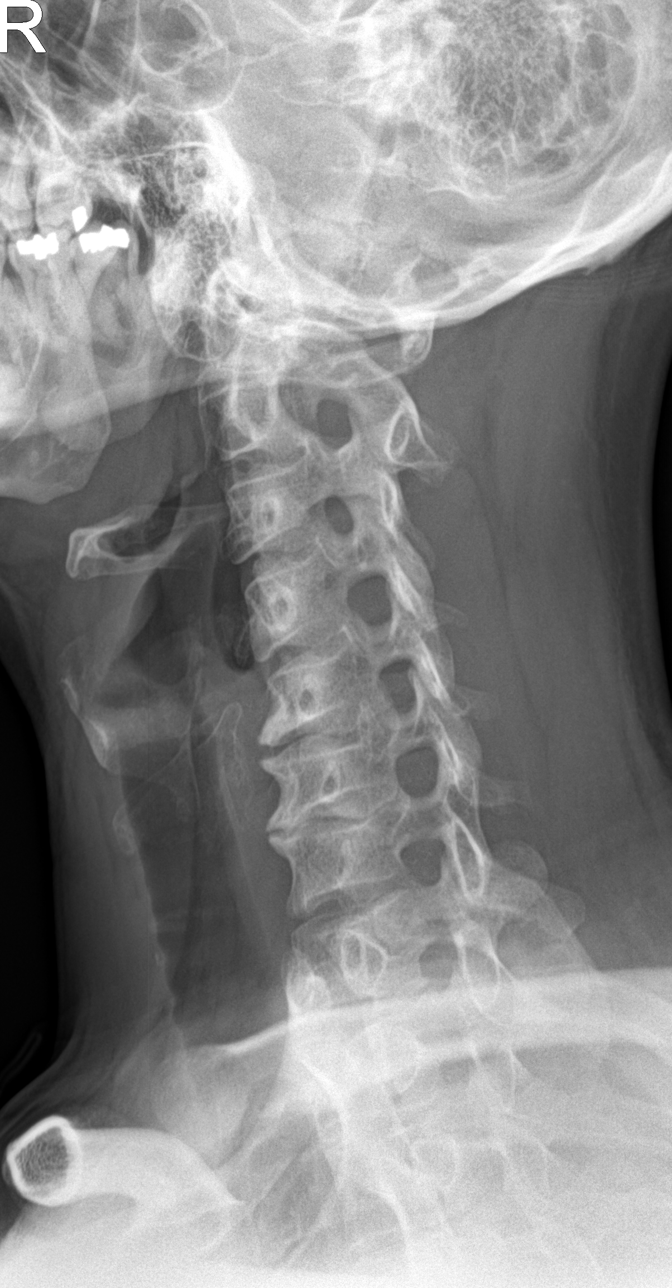

[c-spine ap]
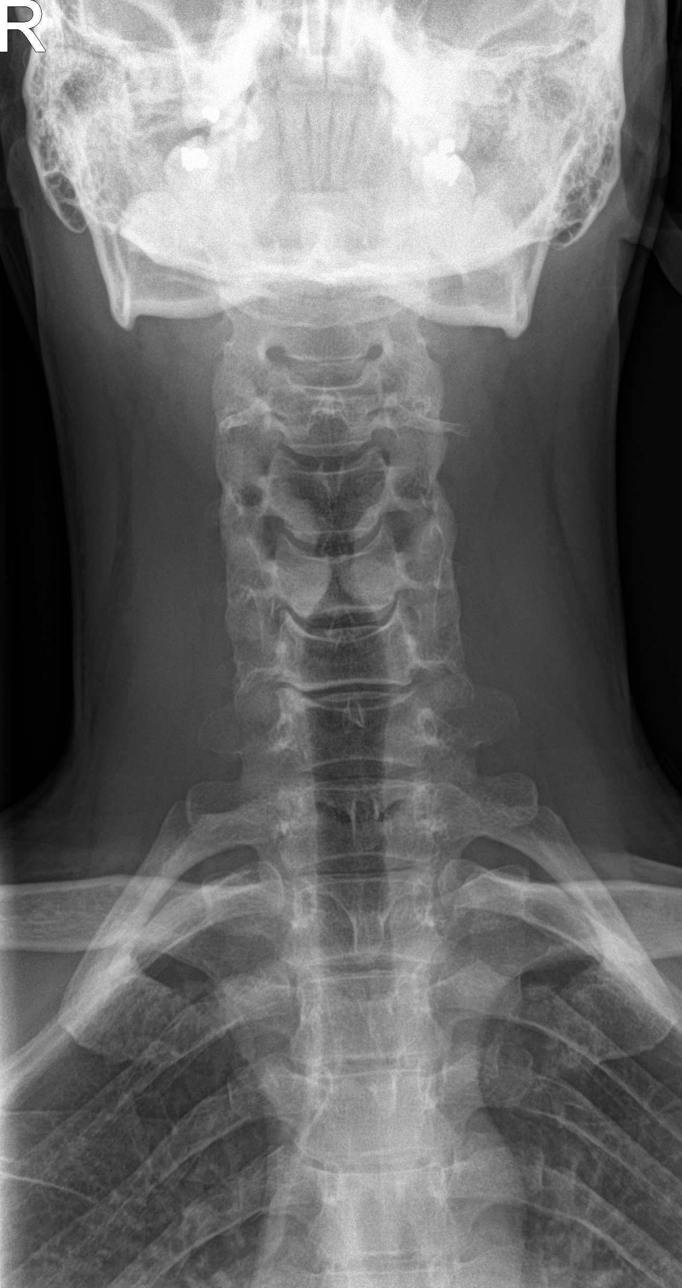

[c-spine open mouth]
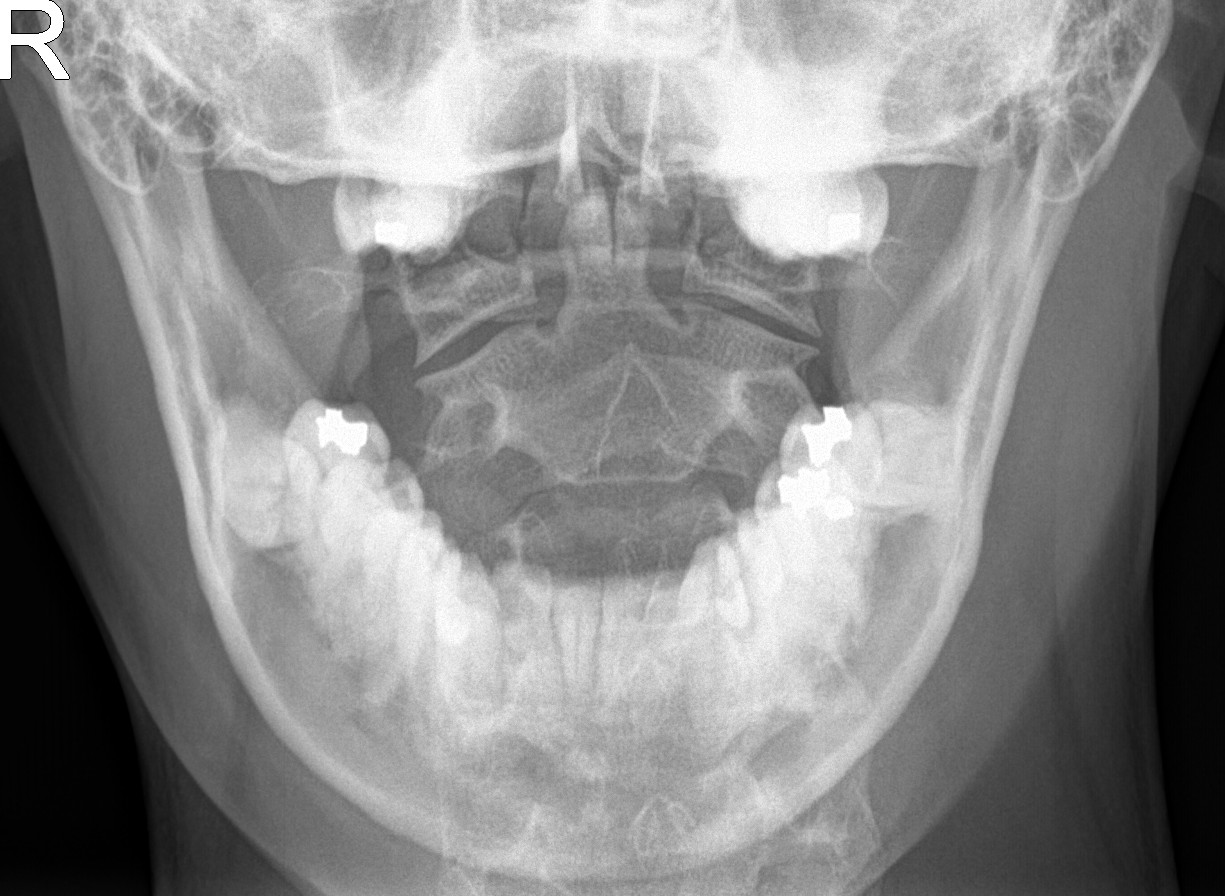

[5 of 5 positions shown; findings below may reference images not displayed]

FINDINGS: Seven cervical segments are well visualized. Vertebral body height
is well maintained. Mild disc space narrowing at C5-6 and C6-7 is
noted with osteophytic change. No prevertebral soft tissue
abnormality is noted. Mild neural foraminal narrowing is noted on
the right at C5-6 and C6-7. Left neural foramina are patent. No
other focal abnormality is seen.
IMPRESSION: New degenerative change at C5-6 and C6-7.

## 2021-02-09 ENCOUNTER — Telehealth: Payer: Self-pay | Admitting: Family Medicine

## 2021-02-09 ENCOUNTER — Other Ambulatory Visit: Payer: Self-pay | Admitting: Family Medicine

## 2021-02-09 DIAGNOSIS — I10 Essential (primary) hypertension: Secondary | ICD-10-CM

## 2021-02-09 MED ORDER — LISINOPRIL 20 MG PO TABS: 20.0000 mg | ORAL_TABLET | Freq: Every day | ORAL | 0 refills | Status: DC

## 2021-02-09 NOTE — Telephone Encounter (Signed)
  Prescription Request  02/09/2021  What is the name of the medication or equipment? Lisinopril  Have you contacted your pharmacy to request a refill? (if applicable) yes  Which pharmacy would you like this sent to? [facility]   Patient notified that their request is being sent to the clinical staff for review and that they should receive a response within 2 business days.

## 2021-02-09 NOTE — Telephone Encounter (Signed)
Pt did make an appt before I called him, refill sent to pharmacy.

## 2021-03-23 ENCOUNTER — Other Ambulatory Visit: Payer: Self-pay

## 2021-03-23 ENCOUNTER — Encounter: Payer: Self-pay | Admitting: Family Medicine

## 2021-03-23 ENCOUNTER — Ambulatory Visit (INDEPENDENT_AMBULATORY_CARE_PROVIDER_SITE_OTHER): Payer: Self-pay | Admitting: Family Medicine

## 2021-03-23 VITALS — BP 121/82 | HR 82 | Temp 97.7°F | Ht 70.0 in | Wt 196.4 lb

## 2021-03-23 DIAGNOSIS — M25551 Pain in right hip: Secondary | ICD-10-CM

## 2021-03-23 MED ORDER — DICLOFENAC SODIUM 75 MG PO TBEC: 75.0000 mg | DELAYED_RELEASE_TABLET | Freq: Two times a day (BID) | ORAL | 0 refills | Status: DC

## 2021-03-23 MED ORDER — PREDNISONE 10 MG (21) PO TBPK: ORAL_TABLET | ORAL | 0 refills | Status: DC

## 2021-03-23 NOTE — Progress Notes (Signed)
Acute Office Visit  Subjective:    Patient ID: Joseph Cabrera, male    DOB: [birth year], 35 y.o.   MRN: [id number]  Chief Complaint  Patient presents with  . Hip Pain    HPI Patient is in today for acute right hip pain. The pain is in the back of his right hip. The pain is achy in his hip. He has intermittent sharp pains that radiate down his right leg. The pain is a 6. The pain is worse while standing. He stands all day at work. Reports some tingling in his right leg. Denies injury or numbness. Heat and rest has been helpful. He has missed the last two days of work due to the pain. He has tried tylenol with some short term relief. Denies muscle spams, changes in bowel or bladder control, or changes in balance. He has a history of chronic back pain and a history of scoliosis. He reports that his pain pain is not any worse than usual for him.   Past Medical History:  Diagnosis Date  . Hypertension     Past Surgical History:  Procedure Laterality Date  . TREATMENT FISTULA ANAL      Family History  Problem Relation Age of Onset  . Hypertension Sister     Social History   Socioeconomic History  . Marital status: Married    Spouse name: Not on file  . Number of children: Not on file  . Years of education: Not on file  . Highest education level: Not on file  Occupational History  . Not on file  Tobacco Use  . Smoking status: Former Games developer  . Smokeless tobacco: Current User    Types: Chew  Vaping Use  . Vaping Use: Never used  Substance and Sexual Activity  . Alcohol use: Yes    Comment: occasional  . Drug use: No  . Sexual activity: Not on file  Other Topics Concern  . Not on file  Social History Narrative  . Not on file   Social Determinants of Health   Financial Resource Strain: Not on file  Food Insecurity: Not on file  Transportation Needs: Not on file  Physical Activity: Not on file  Stress: Not on file  Social Connections: Not on file  Intimate Partner  Violence: Not on file    Outpatient Medications Prior to Visit  Medication Sig Dispense Refill  . cetirizine (ZYRTEC) 10 MG tablet Take 1 tablet (10 mg total) by mouth daily. 30 tablet 11  . fluticasone (FLONASE) 50 MCG/ACT nasal spray Place into both nostrils daily.    Marland Kitchen lisinopril (ZESTRIL) 20 MG tablet Take 1 tablet (20 mg total) by mouth daily. (Needs to be seen before next refill) 30 tablet 0  . doxycycline (VIBRA-TABS) 100 MG tablet Take 1 tablet (100 mg total) by mouth 2 (two) times daily. After 10 days, stay on 1 daily 40 tablet 11  . meloxicam (MOBIC) 15 MG tablet Take 1 tablet (15 mg total) by mouth daily as needed for pain. 30 tablet 1  . tiZANidine (ZANAFLEX) 4 MG tablet Take 0.5-1 tablets (2-4 mg total) by mouth every 8 (eight) hours as needed for muscle spasms. 30 tablet 0   No facility-administered medications prior to visit.    Allergies  Allergen Reactions  . Shellfish Allergy Anaphylaxis    Review of Systems As per HPI.     Objective:    Physical Exam Vitals and nursing note reviewed.  Constitutional:  General: He is not in acute distress.    Appearance: Normal appearance. He is not ill-appearing, toxic-appearing or diaphoretic.  HENT:     Head: Normocephalic and atraumatic.  Pulmonary:     Effort: Pulmonary effort is normal. No respiratory distress.  Musculoskeletal:     Lumbar back: No swelling, edema, deformity, signs of trauma, tenderness or bony tenderness. Positive right straight leg raise test.     Right hip: Tenderness (muscular) present. No bony tenderness. Normal range of motion. Normal strength.  Neurological:     General: No focal deficit present.     Mental Status: He is alert and oriented to person, place, and time.     Motor: No weakness.     Gait: Gait normal.  Psychiatric:        Mood and Affect: Mood normal.        Behavior: Behavior normal.        Thought Content: Thought content normal.        Judgment: Judgment normal.      BP 121/82   Pulse 82   Temp 97.7 F (36.5 C) (Temporal)   Ht 5\' 10"  (1.778 m)   Wt 196 lb 6 oz (89.1 kg)   BMI 28.18 kg/m  Wt Readings from Last 3 Encounters:  03/23/21 196 lb 6 oz (89.1 kg)  06/27/20 198 lb 12.8 oz (90.2 kg)  07/27/19 188 lb 3.2 oz (85.4 kg)    Health Maintenance Due  Topic Date Due  . COVID-19 Vaccine (1) Never done    There are no preventive care reminders to display for this patient.   No results found for: TSH Lab Results  Component Value Date   WBC 7.8 02/10/2016   HGB 16.6 02/10/2016   HCT 48.3 02/10/2016   MCV 88.1 02/10/2016   PLT 288 02/10/2016   Lab Results  Component Value Date   NA 138 07/19/2020   K 4.5 07/19/2020   CO2 25 07/19/2020   GLUCOSE 52 (L) 07/19/2020   BUN 9 07/19/2020   CREATININE 0.91 07/19/2020   BILITOT 0.3 07/19/2020   ALKPHOS 53 07/19/2020   AST 16 07/19/2020   ALT 14 07/19/2020   PROT 7.1 07/19/2020   ALBUMIN 4.6 07/19/2020   CALCIUM 9.6 07/19/2020   ANIONGAP 6 02/10/2016   No results found for: CHOL No results found for: HDL No results found for: LDLCALC No results found for: TRIG No results found for: CHOLHDL No results found for: 02/12/2016     Assessment & Plan:   Joseph Cabrera was seen today for hip pain.  Diagnoses and all orders for this visit:  Acute pain of right hip Discussed muscular pain vs sciatica from low back pain. Diclofenac as below. Prednisone taper sent in. Handout with exercises given. Work note given.  -     predniSONE (STERAPRED UNI-PAK 21 TAB) 10 MG (21) TBPK tablet; Use as directed on back of pill pack -     diclofenac (VOLTAREN) 75 MG EC tablet; Take 1 tablet (75 mg total) by mouth 2 (two) times daily.  Return to office for new or worsening symptoms, or if symptoms persist.   The patient indicates understanding of these issues and agrees with the plan.   Ivin Booty, FNP

## 2021-03-23 NOTE — Patient Instructions (Signed)
Sciatica  Sciatica is pain, numbness, weakness, or tingling along the path of the sciatic nerve. The sciatic nerve starts in the lower back and runs down the back of each leg. The nerve controls the muscles in the lower leg and in the back of the knee. It also provides feeling (sensation) to the back of the thigh, the lower leg, and the sole of the foot. Sciatica is a symptom of another medical condition that pinches or puts pressure on the sciatic nerve. Sciatica most often only affects one side of the body. Sciatica usually goes away on its own or with treatment. In some cases, sciatica may come back (recur). What are the causes? This condition is caused by pressure on the sciatic nerve or pinching of the nerve. This may be the result of:  A disk in between the bones of the spine bulging out too far (herniated disk).  Age-related changes in the spinal disks.  A pain disorder that affects a muscle in the buttock.  Extra bone growth near the sciatic nerve.  A break (fracture) of the pelvis.  Pregnancy.  Tumor. This is rare. What increases the risk? The following factors may make you more likely to develop this condition:  Playing sports that place pressure or stress on the spine.  Having poor strength and flexibility.  A history of back injury or surgery.  Sitting for long periods of time.  Doing activities that involve repetitive bending or lifting.  Obesity. What are the signs or symptoms? Symptoms can vary from mild to very severe, and they may include:  Any of these problems in the lower back, leg, hip, or buttock: ? Mild tingling, numbness, or dull aches. ? Burning sensations. ? Sharp pains.  Numbness in the back of the calf or the sole of the foot.  Leg weakness.  Severe back pain that makes movement difficult. Symptoms may get worse when you cough, sneeze, or laugh, or when you sit or stand for long periods of time. How is this diagnosed? This condition may  be diagnosed based on:  Your symptoms and medical history.  A physical exam.  Blood tests.  Imaging tests, such as: ? X-rays. ? MRI. ? CT scan. How is this treated? In many cases, this condition improves on its own without treatment. However, treatment may include:  Reducing or modifying physical activity.  Exercising and stretching.  Icing and applying heat to the affected area.  Medicines that help to: ? Relieve pain and swelling. ? Relax your muscles.  Injections of medicines that help to relieve pain, irritation, and inflammation around the sciatic nerve (steroids).  Surgery. Follow these instructions at home: Medicines  Take over-the-counter and prescription medicines only as told by your health care provider.  Ask your health care provider if the medicine prescribed to you: ? Requires you to avoid driving or using heavy machinery. ? Can cause constipation. You may need to take these actions to prevent or treat constipation:  Drink enough fluid to keep your urine pale yellow.  Take over-the-counter or prescription medicines.  Eat foods that are high in fiber, such as beans, whole grains, and fresh fruits and vegetables.  Limit foods that are high in fat and processed sugars, such as fried or sweet foods. Managing pain  If directed, put ice on the affected area. ? Put ice in a plastic bag. ? Place a towel between your skin and the bag. ? Leave the ice on for 20 minutes, 2-3 times a day.  If directed, apply heat to the affected area. Use the heat source that your health care provider recommends, such as a moist heat pack or a heating pad. ? Place a towel between your skin and the heat source. ? Leave the heat on for 20-30 minutes. ? Remove the heat if your skin turns bright red. This is especially important if you are unable to feel pain, heat, or cold. You may have a greater risk of getting burned.      Activity  Return to your normal activities as told  by your health care provider. Ask your health care provider what activities are safe for you.  Avoid activities that make your symptoms worse.  Take brief periods of rest throughout the day. ? When you rest for longer periods, mix in some mild activity or stretching between periods of rest. This will help to prevent stiffness and pain. ? Avoid sitting for long periods of time without moving. Get up and move around at least one time each hour.  Exercise and stretch regularly, as told by your health care provider.  Do not lift anything that is heavier than 10 lb (4.5 kg) while you have symptoms of sciatica. When you do not have symptoms, you should still avoid heavy lifting, especially repetitive heavy lifting.  When you lift objects, always use proper lifting technique, which includes: ? Bending your knees. ? Keeping the load close to your body. ? Avoiding twisting.   General instructions  Maintain a healthy weight. Excess weight puts extra stress on your back.  Wear supportive, comfortable shoes. Avoid wearing high heels.  Avoid sleeping on a mattress that is too soft or too hard. A mattress that is firm enough to support your back when you sleep may help to reduce your pain.  Keep all follow-up visits as told by your health care provider. This is important. Contact a health care provider if:  You have pain that: ? Wakes you up when you are sleeping. ? Gets worse when you lie down. ? Is worse than you have experienced in the past. ? Lasts longer than 4 weeks.  You have an unexplained weight loss. Get help right away if:  You are not able to control when you urinate or have bowel movements (incontinence).  You have: ? Weakness in your lower back, pelvis, buttocks, or legs that gets worse. ? Redness or swelling of your back. ? A burning sensation when you urinate. Summary  Sciatica is pain, numbness, weakness, or tingling along the path of the sciatic nerve.  This  condition is caused by pressure on the sciatic nerve or pinching of the nerve.  Sciatica can cause pain, numbness, or tingling in the lower back, legs, hips, and buttocks.  Treatment often includes rest, exercise, medicines, and applying ice or heat. This information is not intended to replace advice given to you by your health care provider. Make sure you discuss any questions you have with your health care provider. Document Revised: 12/01/2018 Document Reviewed: 12/01/2018 Elsevier Patient Education  2021 [employer]   Back Exercises The following exercises strengthen the muscles that help to support the trunk and back. They also help to keep the lower back flexible. Doing these exercises can help to prevent back pain or lessen existing pain.  If you have back pain or discomfort, try doing these exercises 2-3 times each day or as told by your health care provider.  As your pain improves, do them once each day, but increase  the number of times that you repeat the steps for each exercise (do more repetitions).  To prevent the recurrence of back pain, continue to do these exercises once each day or as told by your health care provider. Do exercises exactly as told by your health care provider and adjust them as directed. It is normal to feel mild stretching, pulling, tightness, or discomfort as you do these exercises, but you should stop right away if you feel sudden pain or your pain gets worse. Exercises Single knee to chest Repeat these steps 3-5 times for each leg: 1. Lie on your back on a firm bed or the floor with your legs extended. 2. Bring one knee to your chest. Your other leg should stay extended and in contact with the floor. 3. Hold your knee in place by grabbing your knee or thigh with both hands and hold. 4. Pull on your knee until you feel a gentle stretch in your lower back or buttocks. 5. Hold the stretch for 10-30 seconds. 6. Slowly release and straighten your  leg. Pelvic tilt Repeat these steps 5-10 times: 1. Lie on your back on a firm bed or the floor with your legs extended. 2. Bend your knees so they are pointing toward the ceiling and your feet are flat on the floor. 3. Tighten your lower abdominal muscles to press your lower back against the floor. This motion will tilt your pelvis so your tailbone points up toward the ceiling instead of pointing to your feet or the floor. 4. With gentle tension and even breathing, hold this position for 5-10 seconds. Cat-cow Repeat these steps until your lower back becomes more flexible: 1. Get into a hands-and-knees position on a firm surface. Keep your hands under your shoulders, and keep your knees under your hips. You may place padding under your knees for comfort. 2. Let your head hang down toward your chest. Contract your abdominal muscles and point your tailbone toward the floor so your lower back becomes rounded like the back of a cat. 3. Hold this position for 5 seconds. 4. Slowly lift your head, let your abdominal muscles relax and point your tailbone up toward the ceiling so your back forms a sagging arch like the back of a cow. 5. Hold this position for 5 seconds.   Press-ups Repeat these steps 5-10 times: 1. Lie on your abdomen (face-down) on the floor. 2. Place your palms near your head, about shoulder-width apart. 3. Keeping your back as relaxed as possible and keeping your hips on the floor, slowly straighten your arms to raise the top half of your body and lift your shoulders. Do not use your back muscles to raise your upper torso. You may adjust the placement of your hands to make yourself more comfortable. 4. Hold this position for 5 seconds while you keep your back relaxed. 5. Slowly return to lying flat on the floor.   Bridges Repeat these steps 10 times: 1. Lie on your back on a firm surface. 2. Bend your knees so they are pointing toward the ceiling and your feet are flat on the floor.  Your arms should be flat at your sides, next to your body. 3. Tighten your buttocks muscles and lift your buttocks off the floor until your waist is at almost the same height as your knees. You should feel the muscles working in your buttocks and the back of your thighs. If you do not feel these muscles, slide your feet 1-2 inches farther  away from your buttocks. 4. Hold this position for 3-5 seconds. 5. Slowly lower your hips to the starting position, and allow your buttocks muscles to relax completely. If this exercise is too easy, try doing it with your arms crossed over your chest.   Abdominal crunches Repeat these steps 5-10 times: 1. Lie on your back on a firm bed or the floor with your legs extended. 2. Bend your knees so they are pointing toward the ceiling and your feet are flat on the floor. 3. Cross your arms over your chest. 4. Tip your chin slightly toward your chest without bending your neck. 5. Tighten your abdominal muscles and slowly raise your trunk (torso) high enough to lift your shoulder blades a tiny bit off the floor. Avoid raising your torso higher than that because it can put too much stress on your low back and does not help to strengthen your abdominal muscles. 6. Slowly return to your starting position. Back lifts Repeat these steps 5-10 times: 1. Lie on your abdomen (face-down) with your arms at your sides, and rest your forehead on the floor. 2. Tighten the muscles in your legs and your buttocks. 3. Slowly lift your chest off the floor while you keep your hips pressed to the floor. Keep the back of your head in line with the curve in your back. Your eyes should be looking at the floor. 4. Hold this position for 3-5 seconds. 5. Slowly return to your starting position. Contact a health care provider if:  Your back pain or discomfort gets much worse when you do an exercise.  Your worsening back pain or discomfort does not lessen within 2 hours after you  exercise. If you have any of these problems, stop doing these exercises right away. Do not do them again unless your health care provider says that you can. Get help right away if:  You develop sudden, severe back pain. If this happens, stop doing the exercises right away. Do not do them again unless your health care provider says that you can. This information is not intended to replace advice given to you by your health care provider. Make sure you discuss any questions you have with your health care provider. Document Revised: 03/19/2019 Document Reviewed: 08/14/2018 Elsevier Patient Education  2021 [employer].

## 2021-03-27 ENCOUNTER — Telehealth: Payer: Self-pay

## 2021-03-27 NOTE — Telephone Encounter (Signed)
Note re-written  Placed up front = pt aware

## 2021-03-30 ENCOUNTER — Encounter: Payer: Self-pay | Admitting: Family Medicine

## 2021-03-30 ENCOUNTER — Ambulatory Visit (INDEPENDENT_AMBULATORY_CARE_PROVIDER_SITE_OTHER): Payer: Self-pay | Admitting: Family Medicine

## 2021-03-30 DIAGNOSIS — M5431 Sciatica, right side: Secondary | ICD-10-CM

## 2021-03-30 MED ORDER — TIZANIDINE HCL 4 MG PO CAPS: 4.0000 mg | ORAL_CAPSULE | Freq: Three times a day (TID) | ORAL | 0 refills | Status: DC | PRN

## 2021-03-30 NOTE — Progress Notes (Signed)
Virtual Visit  Note Due to COVID-19 pandemic this visit was conducted virtually. This visit type was conducted due to national recommendations for restrictions regarding the COVID-19 Pandemic (e.g. social distancing, sheltering in place) in an effort to limit this patient's exposure and mitigate transmission in our community. All issues noted in this document were discussed and addressed.  A physical exam was not performed with this format.  I connected with Joseph Cabrera on 03/30/21 at 1249 by telephone and verified that I am speaking with the correct person using two identifiers. Joseph Cabrera is currently located at home and no one is currently with the during the visit. The provider, Gabriel Earing, FNP is located in their office at time of visit.  I discussed the limitations, risks, security and privacy concerns of performing an evaluation and management service by telephone and the availability of in person appointments. I also discussed with the patient that there may be a patient responsible charge related to this service. The patient expressed understanding and agreed to proceed.  CC: sciatica  History and Present Illness:  HPI  Joseph Cabrera was seen in the office on 03/23/21 for sciatica on his right side. The pain is achy with intermittent sharp pains that radiate from down his right leg. The pain is moderate. He denies injury or numbness. He does have some tingling. He has finished a prednisone back and is taking Voltaren. He has tried tylenol as well. He has a history of chronic back pain and scoliosis. He took some time off work as week as he has to stand for long periods at work and this makes his pain worse. He went back to work 2 days ago but had to call out today as the pain after working yesterday was intense. Denies changes in bowel or bladder control.     ROS As per HPI.    Observations/Objective: Alert and oriented x 3. Able to speak in full sentences without difficulty.     Assessment and Plan: Joseph Cabrera was seen today for sciatica.  Diagnoses and all orders for this visit:  Sciatica of right side Continue diclofenac, heat, rest. Handout with exercises was given at last visit. Try zanaflex as below. Work noted provided for today. Discussed xray and PT. Patient declined both and will notify me if he changes his mind.  -     tiZANidine (ZANAFLEX) 4 MG capsule; Take 1 capsule (4 mg total) by mouth 3 (three) times daily as needed for muscle spasms.  Follow Up Instructions: Return to office for new or worsening symptoms, or if symptoms persist.     I discussed the assessment and treatment plan with the patient. The patient was provided an opportunity to ask questions and all were answered. The patient agreed with the plan and demonstrated an understanding of the instructions.   The patient was advised to call back or seek an in-person evaluation if the symptoms worsen or if the condition fails to improve as anticipated.  The above assessment and management plan was discussed with the patient. The patient verbalized understanding of and has agreed to the management plan. Patient is aware to call the clinic if symptoms persist or worsen. Patient is aware when to return to the clinic for a follow-up visit. Patient educated on when it is appropriate to go to the emergency department.   Time call ended: 1300   I provided 11 minutes of  non face-to-face time during this encounter.    Gabriel Earing, FNP

## 2021-04-10 ENCOUNTER — Encounter: Payer: Self-pay | Admitting: Family Medicine

## 2021-04-10 ENCOUNTER — Ambulatory Visit (INDEPENDENT_AMBULATORY_CARE_PROVIDER_SITE_OTHER): Payer: Self-pay | Admitting: Family Medicine

## 2021-04-10 ENCOUNTER — Other Ambulatory Visit: Payer: Self-pay

## 2021-04-10 VITALS — BP 125/85 | HR 79 | Temp 98.3°F | Ht 70.0 in | Wt 194.0 lb

## 2021-04-10 DIAGNOSIS — R110 Nausea: Secondary | ICD-10-CM

## 2021-04-10 DIAGNOSIS — I10 Essential (primary) hypertension: Secondary | ICD-10-CM

## 2021-04-10 MED ORDER — LISINOPRIL 20 MG PO TABS: 20.0000 mg | ORAL_TABLET | Freq: Every day | ORAL | 3 refills | Status: AC

## 2021-04-10 NOTE — Patient Instructions (Signed)
Nausea, Adult °Nausea is feeling sick to your stomach or feeling that you are about to throw up (vomit). Feeling sick to your stomach is usually not serious, but it may be an early sign of a more serious medical problem. °As you feel sicker to your stomach, you may throw up. If you throw up, or if you are not able to drink enough fluids, there is a risk that you may lose too much water in your body (get dehydrated). If you lose too much water in your body, you may: °· Feel tired. °· Feel thirsty. °· Have a dry mouth. °· Have cracked lips. °· Go pee (urinate) less often. °Older adults and people who have other diseases or a weak body defense system (immune system) have a higher risk of losing too much water in the body. °The main goals of treating this condition are: °· To relieve your nausea. °· To ensure your nausea occurs less often. °· To prevent throwing up and losing too much fluid. °Follow these instructions at home: °Watch your symptoms for any changes. Tell your doctor about them. Follow these instructions as told by your doctor. °Eating and drinking °· Take an ORS (oral rehydration solution). This is a drink that is sold at pharmacies and stores. °· Drink clear fluids in small amounts as you are able. These include: °? Water. °? Ice chips. °? Fruit juice that has water added (diluted fruit juice). °? Low-calorie sports drinks. °· Eat bland, easy-to-digest foods in small amounts as you are able, such as: °? Bananas. °? Applesauce. °? Rice. °? Low-fat (lean) meats. °? Toast. °? Crackers. °· Avoid drinking fluids that have a lot of sugar or caffeine in them. This includes energy drinks, sports drinks, and soda. °· Avoid alcohol. °· Avoid spicy or fatty foods.  °  °  °General instructions °· Take over-the-counter and prescription medicines only as told by your doctor. °· Rest at home while you get better. °· Drink enough fluid to keep your pee (urine) pale yellow. °· Take slow and deep breaths when you feel  sick to your stomach. °· Avoid food or things that have strong smells. °· Wash your hands often with soap and water. If you cannot use soap and water, use hand sanitizer. °· Make sure that all people in your home wash their hands well and often. °· Keep all follow-up visits as told by your doctor. This is important. °Contact a doctor if: °· You feel sicker to your stomach. °· You feel sick to your stomach for more than 2 days. °· You throw up. °· You are not able to drink fluids without throwing up. °· You have new symptoms. °· You have a fever. °· You have a headache. °· You have muscle cramps. °· You have a rash. °· You have pain while peeing. °· You feel light-headed or dizzy. °Get help right away if: °· You have pain in your chest, neck, arm, or jaw. °· You feel very weak or you pass out (faint). °· You have throw up that is bright red or looks like coffee grounds. °· You have bloody or black poop (stools) or poop that looks like tar. °· You have a very bad headache, a stiff neck, or both. °· You have very bad pain, cramping, or bloating in your belly (abdomen). °· You have trouble breathing or you are breathing very quickly. °· Your heart is beating very quickly. °· Your skin feels cold and clammy. °· You feel confused. °·   You have signs of losing too much water in your body, such as: °? Dark pee, very little pee, or no pee. °? Cracked lips. °? Dry mouth. °? Sunken eyes. °? Sleepiness. °? Weakness. °These symptoms may be an emergency. Do not wait to see if the symptoms will go away. Get medical help right away. Call your local emergency services (911 in the [place] Do not drive yourself to the hospital. °Summary °· Nausea is feeling sick to your stomach or feeling that you are about to throw up (vomit). °· If you throw up, or if you are not able to drink enough fluids, there is a risk that you may lose too much water in your body (get dehydrated). °· Eat and drink what your doctor tells you. Take  over-the-counter and prescription medicines only as told by your doctor. °· Contact a doctor right away if your symptoms get worse or you have new symptoms. °· Keep all follow-up visits as told by your doctor. This is important. °This information is not intended to replace advice given to you by your health care provider. Make sure you discuss any questions you have with your health care provider. °Document Revised: 10/13/2019 Document Reviewed: 04/22/2018 °Elsevier Patient Education © 2021 Elsevier Inc. ° °

## 2021-04-10 NOTE — Progress Notes (Signed)
   Subjective: CC: Hypertension PCP: Raliegh Ip, DO Joseph Cabrera is a 35 y.o. male presenting to clinic today for:  1.  Hypertension Patient here for medication refill on lisinopril.  He is compliant with medication.  No chest pain, shortness of breath, edema or dizziness.  2.  Nausea Patient ate expired lasagna last evening has had some abdominal queasiness.  Denies any overt pain, diarrhea or vomiting.  No fevers.  Wife is sick with similar.   ROS: Per HPI  Allergies  Allergen Reactions  . Shellfish Allergy Anaphylaxis   Past Medical History:  Diagnosis Date  . Hypertension     Current Outpatient Medications:  .  cetirizine (ZYRTEC) 10 MG tablet, Take 1 tablet (10 mg total) by mouth daily., Disp: 30 tablet, Rfl: 11 .  fluticasone (FLONASE) 50 MCG/ACT nasal spray, Place into both nostrils daily., Disp: , Rfl:  .  lisinopril (ZESTRIL) 20 MG tablet, Take 1 tablet (20 mg total) by mouth daily. (Needs to be seen before next refill), Disp: 30 tablet, Rfl: 0 Social History   Socioeconomic History  . Marital status: Married    Spouse name: Not on file  . Number of children: Not on file  . Years of education: Not on file  . Highest education level: Not on file  Occupational History  . Not on file  Tobacco Use  . Smoking status: Former Games developer  . Smokeless tobacco: Current User    Types: Chew  Vaping Use  . Vaping Use: Never used  Substance and Sexual Activity  . Alcohol use: Yes    Comment: occasional  . Drug use: No  . Sexual activity: Not on file  Other Topics Concern  . Not on file  Social History Narrative  . Not on file   Social Determinants of Health   Financial Resource Strain: Not on file  Food Insecurity: Not on file  Transportation Needs: Not on file  Physical Activity: Not on file  Stress: Not on file  Social Connections: Not on file  Intimate Partner Violence: Not on file   Family History  Problem Relation Age of Onset  .  Hypertension Sister     Objective: Office vital signs reviewed. BP 125/85   Pulse 79   Temp 98.3 F (36.8 C)   Ht 5\' 10"  (1.778 m)   Wt 194 lb (88 kg)   SpO2 99%   BMI 27.84 kg/m   Physical Examination:  General: Awake, alert, well nourished, No acute distress Cardio: regular rate and rhythm, S1S2 heard, no murmurs appreciated Pulm: clear to auscultation bilaterally, no wheezes, rhonchi or rales; normal work of breathing on room air GI: soft, non-tender, non-distended, bowel sounds present x4, no hepatomegaly, no splenomegaly, no masses Extremities: warm, well perfused, No edema, cyanosis or clubbing; +2 pulses bilaterally  Assessment/ Plan: 35 y.o. male   Essential hypertension - Plan: lisinopril (ZESTRIL) 20 MG tablet  Nausea  Blood pressure under good control.  Continue lisinopril.  Refills have been sent.  BMP obtained in August was normal  Nausea likely due to expired food.  No acute findings on abdomen exam.  Follow-up as needed.  Handout provided  No orders of the defined types were placed in this encounter.  No orders of the defined types were placed in this encounter.    September, DO [facility] [phone]

## 2021-04-28 ENCOUNTER — Encounter: Payer: Self-pay | Admitting: Family Medicine

## 2021-04-28 ENCOUNTER — Ambulatory Visit (INDEPENDENT_AMBULATORY_CARE_PROVIDER_SITE_OTHER): Payer: BC Managed Care – PPO | Admitting: Family Medicine

## 2021-04-28 ENCOUNTER — Other Ambulatory Visit: Payer: Self-pay

## 2021-04-28 VITALS — BP 117/72 | HR 81 | Temp 97.5°F | Ht 70.0 in | Wt 193.8 lb

## 2021-04-28 DIAGNOSIS — F4310 Post-traumatic stress disorder, unspecified: Secondary | ICD-10-CM

## 2021-04-28 DIAGNOSIS — F411 Generalized anxiety disorder: Secondary | ICD-10-CM | POA: Diagnosis not present

## 2021-04-28 MED ORDER — CITALOPRAM HYDROBROMIDE 10 MG PO TABS: ORAL_TABLET | ORAL | 1 refills | Status: AC

## 2021-04-28 MED ORDER — PRAZOSIN HCL 1 MG PO CAPS: ORAL_CAPSULE | ORAL | 1 refills | Status: AC

## 2021-04-28 NOTE — Progress Notes (Signed)
Acute Office Visit  Subjective:    Patient ID: Joseph Cabrera, male    DOB: 05-09-1986, 35 y.o.   MRN: [id number]  Chief Complaint  Patient presents with  . Panic Attack    HPI Patient is in today for panic attacks. 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 He used to take medication for this that helped a little. 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 He has since started work at [employer] He thinks that hearing gunshots at work has been triggering his PTSD. He has been having nightmares 1-2x a week. He has been having panic attacks 1-2x a week for the last couple of weeks. He will feel jittery, hyperventilate, and have chest pain. After the panic attack he has a terrible headache. This is starting to affect his life and he would like to try medication again.  Depression screen [facility] 2/9 04/28/2021 06/27/2020 07/27/2019  Decreased Interest 0 0 0  Down, Depressed, Hopeless 1 0 0  PHQ - 2 Score 1 0 0  Altered sleeping 1 - -  Tired, decreased energy 1 - -  Change in appetite 0 - -  Feeling bad or failure about yourself  0 - -  Trouble concentrating 0 - -  Moving slowly or fidgety/restless 1 - -  Suicidal thoughts 0 - -  PHQ-9 Score 4 - -  Difficult doing work/chores Very difficult - -   GAD 7 : Generalized Anxiety Score 04/28/2021  Nervous, Anxious, on Edge 1  Control/stop worrying 1  Worry too much - different things 1  Trouble relaxing 1  Restless 1  Easily annoyed or irritable 1  Afraid - awful might happen 0  Total GAD 7 Score 6  Anxiety Difficulty Very difficult     Past Medical History:  Diagnosis Date  . Hypertension     Past Surgical History:  Procedure Laterality Date  . TREATMENT FISTULA ANAL      Family History  Problem Relation Age of Onset  . Hypertension Sister     Social History   Socioeconomic History  . Marital status: Married    Spouse name: Not on file  . Number of children: Not on file   . Years of education: Not on file  . Highest education level: Not on file  Occupational History  . Not on file  Tobacco Use  . Smoking status: Former Games developer  . Smokeless tobacco: Current User    Types: Chew  Vaping Use  . Vaping Use: Never used  Substance and Sexual Activity  . Alcohol use: Yes    Comment: occasional  . Drug use: No  . Sexual activity: Not on file  Other Topics Concern  . Not on file  Social History Narrative  . Not on file   Social Determinants of Health   Financial Resource Strain: Not on file  Food Insecurity: Not on file  Transportation Needs: Not on file  Physical Activity: Not on file  Stress: Not on file  Social Connections: Not on file  Intimate Partner Violence: Not on file    Outpatient Medications Prior to Visit  Medication Sig Dispense Refill  . cetirizine (ZYRTEC) 10 MG tablet Take 1 tablet (10 mg total) by mouth daily. 30 tablet 11  . fluticasone (FLONASE) 50 MCG/ACT nasal spray Place into both nostrils daily.    Marland Kitchen lisinopril (ZESTRIL) 20 MG tablet Take 1 tablet (20 mg total) by mouth  daily. 90 tablet 3   No facility-administered medications prior to visit.    Allergies  Allergen Reactions  . Shellfish Allergy Anaphylaxis    Review of Systems As per HPI.     Objective:    Physical Exam Vitals and nursing note reviewed.  Constitutional:      General: He is not in acute distress.    Appearance: Normal appearance. He is not ill-appearing, toxic-appearing or diaphoretic.  Pulmonary:     Effort: Pulmonary effort is normal. No respiratory distress.  Musculoskeletal:     Right lower leg: No edema.     Left lower leg: No edema.  Skin:    General: Skin is warm and dry.  Neurological:     General: No focal deficit present.     Mental Status: He is alert and oriented to person, place, and time.  Psychiatric:        Mood and Affect: Mood normal.        Behavior: Behavior normal.        Thought Content: Thought content normal.         Judgment: Judgment normal.     There were no vitals taken for this visit. Wt Readings from Last 3 Encounters:  04/10/21 194 lb (88 kg)  03/23/21 196 lb 6 oz (89.1 kg)  06/27/20 198 lb 12.8 oz (90.2 kg)    Health Maintenance Due  Topic Date Due  . COVID-19 Vaccine (1) Never done    There are no preventive care reminders to display for this patient.   No results found for: TSH Lab Results  Component Value Date   WBC 7.8 02/10/2016   HGB 16.6 02/10/2016   HCT 48.3 02/10/2016   MCV 88.1 02/10/2016   PLT 288 02/10/2016   Lab Results  Component Value Date   NA 138 07/19/2020   K 4.5 07/19/2020   CO2 25 07/19/2020   GLUCOSE 52 (L) 07/19/2020   BUN 9 07/19/2020   CREATININE 0.91 07/19/2020   BILITOT 0.3 07/19/2020   ALKPHOS 53 07/19/2020   AST 16 07/19/2020   ALT 14 07/19/2020   PROT 7.1 07/19/2020   ALBUMIN 4.6 07/19/2020   CALCIUM 9.6 07/19/2020   ANIONGAP 6 02/10/2016   No results found for: CHOL No results found for: HDL No results found for: LDLCALC No results found for: TRIG No results found for: CHOLHDL No results found for: TUUE2C     Assessment & Plan:   [name] was seen today for panic attack.  Diagnoses and all orders for this visit:  PTSD (post-traumatic stress disorder) Denies SI. Used take trazodone. He would prefer to try something less sedating. Will try celexa as below. Prazosin as below for nightmares. Handout given.  -     citalopram (CELEXA) 10 MG tablet; Take 1 tablet daily for 7 days. Then increase to 2 tablets daily. -     prazosin (MINIPRESS) 1 MG capsule; Take 1 tablet at bedtime. After 2 days, increase to 2 tablets at bedtime.  Generalized anxiety disorder With panic attacks. GAD score of 6 today. Start Celexa as below.  -     citalopram (CELEXA) 10 MG tablet; Take 1 tablet daily for 7 days. Then increase to 2 tablets daily.  Return in about 6 weeks (around 06/09/2021).  The patient indicates understanding of these issues and  agrees with the plan.  Gabriel Earing, FNP

## 2021-04-28 NOTE — Patient Instructions (Signed)
Managing Post-Traumatic Stress Disorder If you have been diagnosed with post-traumatic stress disorder (PTSD), you may be relieved that you now know why you have felt or behaved a certain way. Still, you may feel overwhelmed about the treatment ahead. You may also wonder how to get the support you need and how to deal with the condition day-to-day. If you are living with PTSD, there are ways to help you recover from it and manage your symptoms. How to manage lifestyle changes Managing stress Stress is your body's reaction to life changes and events, both good and bad. Stress can make PTSD worse. Take the following steps to manage stress:  Talk with your health care provider or a counselor if you would like to learn more about techniques to reduce your stress. He or she may suggest some stress reduction techniques such as: ? Muscle relaxation exercises. ? Regular exercise. ? Meditation, yoga, or other mind-body exercises. ? Breathing exercises. ? Listening to quiet music. ? Spending time outside.  Maintain a healthy lifestyle. Eat a healthy diet, exercise regularly, get plenty of sleep, and take time to relax.  Spend time with others. Talk with them about how you are feeling and what kind of support you need. Try not to isolate yourself, even though you may feel like doing that. Isolating yourself can delay your recovery.  Do activities and hobbies that you enjoy.  Pace yourself when doing stressful things. Take breaks, and reward yourself when you finish. Make sure that you do not overload your schedule.   Medicines Your health care provider may suggest certain medicines if he or she feels that they will help to improve your condition. Medicines for depression (antidepressants) or severe loss of contact with reality (antipsychotics) may be used to treat PTSD. Avoid using alcohol and other substances that may prevent your medicines from working properly. It is also important to:  Talk with  your pharmacist or health care provider about all medicines that you take, their possible side effects, and which medicines are safe to take together.  Make it your goal to take part in all treatment decisions (shared decision-making). Ask about possible side effects of medicines that your health care provider recommends, and tell him or her how you feel about having those side effects. It is best if shared decision-making with your health care provider is part of your total treatment plan. If your health care provider prescribes a medicine, you may not notice the full benefits of it for 4-8 weeks. Most people who are treated for PTSD need to take medicine for at least 6-12 months before they feel better. If you are taking medicines as part of your treatment, do not stop taking medicines before you ask your health care provider if it is safe to stop. You may need to have the medicine slowly decreased (tapered) over time to lower the risk of harmful side effects. Relationships Many people who have PTSD have difficulty trusting others. Make an effort to:  Take risks and develop trust with close friends and family members. Developing trust in others can help you feel safe and connect you with emotional support.  Be open and honest about your feelings.  Have fun and relax in safe spaces, such as with friends and family.  Think about going to couples counseling, family education classes, or family therapy. Your loved ones may not always know how to be supportive. Therapy can be helpful for everyone. How to recognize changes in your condition Be aware of   your symptoms and how often you have them. The following symptoms mean that you need to seek help for your PTSD:  You feel suspicious and angry.  You have repeated flashbacks.  You avoid going out or being with others.  You have an increasing number of fights with close friends or family members, such as your spouse.  You have thoughts about  hurting yourself or others.  You cannot get relief from feelings of depression or anxiety. Follow these instructions at home: Lifestyle  Exercise regularly. Try to do 30 or more minutes of physical activity on most days of the week.  Try to get 7-9 hours of sleep each night. To help with sleep: ? Keep your bedroom cool and dark. ? Avoid screen time before bedtime. This means avoiding use of your TV, computer, tablet, and cell phone.  Practice self-soothing skills and use them daily.  Try to have fun and seek humor in your life. Eating and drinking  Do not eat a heavy meal during the hour before you go to bed.  Do not drink alcohol or caffeinated drinks before bed.  Avoid using alcohol or drugs. General instructions  If your PTSD is affecting your marriage or family, seek help from a family therapist.  Remind yourself that recovering from the trauma is a process and takes time.  Take over-the-counter and prescription medicines only as told by your health care provider.  Make sure to let all of your health care providers know that you have PTSD. This is especially important if you are having surgery or need to be admitted to the hospital.  Keep all follow-up visits as told by your health care providers. This is important. Where to find support Talking to others  Explain that PTSD is a mental health problem. It is something that a person can develop after experiencing or seeing a life-threatening event. Tell them that PTSD makes you feel stress like you did during the event.  Talk to your loved ones about the symptoms you have. Also tell them what things or situations can cause symptoms to start (are triggers for you).  Assure your loved ones that there are treatments to help PTSD. Discuss possibly seeking family therapy or couples therapy.  If you are worried or fearful about seeking treatment, ask for support.  Keep daily contact with at least one trusted friend or family  member. Finances Not all insurance plans cover mental health care, so it is important to check with your insurance carrier. If paying for co-pays or counseling services is a problem, search for a local or county mental health care center. Public mental health care services may be offered there at a low cost or no cost when you are not able to see a private health care provider. If you are a veteran, contact a local veterans organization or veterans hospital for more information. If you are taking medicine for PTSD, you may be able to get the genericform, which may be less expensive than brand-name medicine. Some makers of prescription medicines also offer help to patients who cannot afford the medicines that they need. Therapy and support groups  Find a support group in your community. Often, groups are available for military veterans, trauma victims, and family members or caregivers.  Look into volunteer opportunities. Taking part in these can help you feel more connected to your community.  Contact a local organization to find out if you are eligible for a service dog. Where to find more information Go   to this website to find more information about PTSD, treatment of PTSD, and how to get support:  National Center for PTSD: www.ptsd.va.gov Contact a health care provider if:  Your symptoms get worse or do not get better. Get help right away if:  You have thoughts about hurting yourself or others. If you ever feel like you may hurt yourself or others, or have thoughts about taking your own life, get help right away. You can go to your nearest emergency department or call:  Your local emergency services (911 in the [place]  A suicide crisis helpline, such as the National Suicide Prevention Lifeline at 1-800-273-8255. This is open 24-hours a day. Summary  If you are living with PTSD, there are ways to help you recover from it and manage your symptoms.  Find supportive environments and  people who understand PTSD. Spend time in those places, and maintain contact with those people.  Work with your health care team to create a plan for managing PTSD. The plan should include counseling, stress reduction techniques, and healthy lifestyle habits. This information is not intended to replace advice given to you by your health care provider. Make sure you discuss any questions you have with your health care provider. Document Revised: 07/29/2020 Document Reviewed: 07/29/2020 Elsevier Patient Education  2021 Elsevier Inc.  

## 2021-05-01 ENCOUNTER — Telehealth: Payer: Self-pay | Admitting: Family Medicine

## 2021-05-01 NOTE — Telephone Encounter (Signed)
Ok to provide note. If nausea continues after a few days, let me know.

## 2021-05-01 NOTE — Telephone Encounter (Signed)
Pt aware of provider feedback and voiced understanding. Note written and placed up front for pt to pick up.

## 2021-06-07 ENCOUNTER — Encounter: Payer: Self-pay | Admitting: Family Medicine

## 2021-06-07 ENCOUNTER — Ambulatory Visit: Payer: BC Managed Care – PPO | Admitting: Family Medicine

## 2021-06-07 ENCOUNTER — Other Ambulatory Visit: Payer: Self-pay

## 2021-06-07 VITALS — BP 129/87 | HR 66 | Temp 97.8°F | Resp 20 | Ht 70.0 in | Wt 192.0 lb

## 2021-06-07 DIAGNOSIS — L0291 Cutaneous abscess, unspecified: Secondary | ICD-10-CM | POA: Diagnosis not present

## 2021-06-07 MED ORDER — CEPHALEXIN 500 MG PO CAPS: 500.0000 mg | ORAL_CAPSULE | Freq: Two times a day (BID) | ORAL | 0 refills | Status: AC

## 2021-06-07 NOTE — Patient Instructions (Signed)
Skin Abscess  A skin abscess is an infected area on or under your skin that contains a collection of pus and other material. An abscess may also be called a furuncle,carbuncle, or boil. An abscess can occur in or on almost any part of your body. Some abscesses break open (rupture) on their own. Most continue to get worse unless they are treated. The infection can spread deeper into the body and eventually into your blood, whichcan make you feel ill. Treatment usually involves draining the abscess. What are the causes? An abscess occurs when germs, like bacteria, pass through your skin and cause an infection. This may be caused by: A scrape or cut on your skin. A puncture wound through your skin, including a needle injection or insect bite. Blocked oil or sweat glands. Blocked and infected hair follicles. A cyst that forms beneath your skin (sebaceous cyst) and becomes infected. What increases the risk? This condition is more likely to develop in people who: Have a weak body defense system (immune system). Have diabetes. Have dry and irritated skin. Get frequent injections or use illegal IV drugs. Have a foreign body in a wound, such as a splinter. Have problems with their lymph system or veins. What are the signs or symptoms? Symptoms of this condition include: A painful, firm bump under the skin. A bump with pus at the top. This may break through the skin and drain. Other symptoms include: Redness surrounding the abscess site. Warmth. Swelling of the lymph nodes (glands) near the abscess. Tenderness. A sore on the skin. How is this diagnosed? This condition may be diagnosed based on: A physical exam. Your medical history. A sample of pus. This may be used to find out what is causing the infection. Blood tests. Imaging tests, such as an ultrasound, CT scan, or MRI. How is this treated? A small abscess that drains on its own may not need treatment. Treatment for larger abscesses  may include: Moist heat or heat pack applied to the area several times a day. A procedure to drain the abscess (incision and drainage). Antibiotic medicines. For a severe abscess, you may first get antibiotics through an IV and then change to antibiotics by mouth. Follow these instructions at home: Medicines  Take over-the-counter and prescription medicines only as told by your health care provider. If you were prescribed an antibiotic medicine, take it as told by your health care provider. Do not stop taking the antibiotic even if you start to feel better.  Abscess care  If you have an abscess that has not drained, apply heat to the affected area. Use the heat source that your health care provider recommends, such as a moist heat pack or a heating pad. Place a towel between your skin and the heat source. Leave the heat on for 20-30 minutes. Remove the heat if your skin turns bright red. This is especially important if you are unable to feel pain, heat, or cold. You may have a greater risk of getting burned. Follow instructions from your health care provider about how to take care of your abscess. Make sure you: Cover the abscess with a bandage (dressing). Change your dressing or gauze as told by your health care provider. Wash your hands with soap and water before you change the dressing or gauze. If soap and water are not available, use hand sanitizer. Check your abscess every day for signs of a worsening infection. Check for: More redness, swelling, or pain. More fluid or blood. Warmth. More   pus or a bad smell.  General instructions To avoid spreading the infection: Do not share personal care items, towels, or hot tubs with others. Avoid making skin contact with other people. Keep all follow-up visits as told by your health care provider. This is important. Contact a health care provider if you have: More redness, swelling, or pain around your abscess. More fluid or blood coming  from your abscess. Warm skin around your abscess. More pus or a bad smell coming from your abscess. A fever. Muscle aches. Chills or a general ill feeling. Get help right away if you: Have severe pain. See red streaks on your skin spreading away from the abscess. Summary A skin abscess is an infected area on or under your skin that contains a collection of pus and other material. A small abscess that drains on its own may not need treatment. Treatment for larger abscesses may include having a procedure to drain the abscess and taking an antibiotic. This information is not intended to replace advice given to you by your health care provider. Make sure you discuss any questions you have with your healthcare provider. Document Revised: 03/05/2019 Document Reviewed: 12/26/2017 Elsevier Patient Education  2022 Elsevier Inc.  

## 2021-06-07 NOTE — Progress Notes (Signed)
Acute Office Visit  Subjective:    Patient ID: Joseph Cabrera, male    DOB: [birth year], 35 y.o.   MRN: [id number]  Chief Complaint  Patient presents with   Knot on tailbone    Very painful. No injury     HPI Patient is in today for a knot on his tailbone for 2-3 days. The area is painful. It is red. Denies fever or drainage. Denies injury. The area is really painful when he sits, walks, or stands. It is worse at work.   Past Medical History:  Diagnosis Date   Hypertension     Past Surgical History:  Procedure Laterality Date   TREATMENT FISTULA ANAL      Family History  Problem Relation Age of Onset   Hypertension Sister     Social History   Socioeconomic History   Marital status: Married    Spouse name: Not on file   Number of children: Not on file   Years of education: Not on file   Highest education level: Not on file  Occupational History   Not on file  Tobacco Use   Smoking status: Former    Pack years: 0.00   Smokeless tobacco: Current    Types: Chew  Vaping Use   Vaping Use: Never used  Substance and Sexual Activity   Alcohol use: Yes    Comment: occasional   Drug use: No   Sexual activity: Not on file  Other Topics Concern   Not on file  Social History Narrative   Not on file   Social Determinants of Health   Financial Resource Strain: Not on file  Food Insecurity: Not on file  Transportation Needs: Not on file  Physical Activity: Not on file  Stress: Not on file  Social Connections: Not on file  Intimate Partner Violence: Not on file    Outpatient Medications Prior to Visit  Medication Sig Dispense Refill   cetirizine (ZYRTEC) 10 MG tablet Take 1 tablet (10 mg total) by mouth daily. 30 tablet 11   citalopram (CELEXA) 10 MG tablet Take 1 tablet daily for 7 days. Then increase to 2 tablets daily. 60 tablet 1   fluticasone (FLONASE) 50 MCG/ACT nasal spray Place into both nostrils daily.     lisinopril (ZESTRIL) 20 MG tablet Take 1  tablet (20 mg total) by mouth daily. 90 tablet 3   prazosin (MINIPRESS) 1 MG capsule Take 1 tablet at bedtime. After 2 days, increase to 2 tablets at bedtime. 60 capsule 1   No facility-administered medications prior to visit.    Allergies  Allergen Reactions   Shellfish Allergy Anaphylaxis    Review of Systems As per HPI.     Objective:    Physical Exam Vitals and nursing note reviewed.  Constitutional:      General: He is not in acute distress.    Appearance: He is not ill-appearing, toxic-appearing or diaphoretic.  Pulmonary:     Effort: Pulmonary effort is normal. No respiratory distress.  Skin:    General: Skin is warm and dry.     Findings: Abscess (1.5 cm abscess in intergluteal fold. No drainage or fluctuance present) present.  Neurological:     General: No focal deficit present.     Mental Status: He is alert and oriented to person, place, and time.  Psychiatric:        Mood and Affect: Mood normal.        Behavior: Behavior normal.    BP 129/87  Pulse 66   Temp 97.8 F (36.6 C) (Temporal)   Resp 20   Ht 5\' 10"  (1.778 m)   Wt 192 lb (87.1 kg)   SpO2 97%   BMI 27.55 kg/m  Wt Readings from Last 3 Encounters:  06/07/21 192 lb (87.1 kg)  04/28/21 193 lb 12.8 oz (87.9 kg)  04/10/21 194 lb (88 kg)    There are no preventive care reminders to display for this patient.  There are no preventive care reminders to display for this patient.   No results found for: TSH Lab Results  Component Value Date   WBC 7.8 02/10/2016   HGB 16.6 02/10/2016   HCT 48.3 02/10/2016   MCV 88.1 02/10/2016   PLT 288 02/10/2016   Lab Results  Component Value Date   NA 138 07/19/2020   K 4.5 07/19/2020   CO2 25 07/19/2020   GLUCOSE 52 (L) 07/19/2020   BUN 9 07/19/2020   CREATININE 0.91 07/19/2020   BILITOT 0.3 07/19/2020   ALKPHOS 53 07/19/2020   AST 16 07/19/2020   ALT 14 07/19/2020   PROT 7.1 07/19/2020   ALBUMIN 4.6 07/19/2020   CALCIUM 9.6 07/19/2020    ANIONGAP 6 02/10/2016   No results found for: CHOL No results found for: HDL No results found for: LDLCALC No results found for: TRIG No results found for: CHOLHDL No results found for: 02/12/2016     Assessment & Plan:   Joseph Cabrera was seen today for knot on tailbone.  Diagnoses and all orders for this visit:  Abscess Keflex as below. Warm compresses. Return to office for new or worsening symptoms, or if symptoms persist.  -     cephALEXin (KEFLEX) 500 MG capsule; Take 1 capsule (500 mg total) by mouth 2 (two) times daily.  The patient indicates understanding of these issues and agrees with the plan.   Joseph Booty, FNP

## 2021-06-09 ENCOUNTER — Encounter: Payer: Self-pay | Admitting: Family Medicine

## 2021-06-09 ENCOUNTER — Ambulatory Visit: Payer: BC Managed Care – PPO | Admitting: Family Medicine

## 2021-07-12 ENCOUNTER — Encounter: Payer: Self-pay | Admitting: Family Medicine

## 2021-07-13 DIAGNOSIS — Z0001 Encounter for general adult medical examination with abnormal findings: Secondary | ICD-10-CM | POA: Diagnosis not present

## 2021-07-13 DIAGNOSIS — I10 Essential (primary) hypertension: Secondary | ICD-10-CM | POA: Diagnosis not present

## 2021-07-13 DIAGNOSIS — Z6827 Body mass index (BMI) 27.0-27.9, adult: Secondary | ICD-10-CM | POA: Diagnosis not present

## 2021-07-13 DIAGNOSIS — F4310 Post-traumatic stress disorder, unspecified: Secondary | ICD-10-CM | POA: Diagnosis not present
